# Patient Record
Sex: Male | Born: 1945 | Race: White | Hispanic: No | Marital: Married
Health system: Southern US, Community
[De-identification: ages and names within clinical notes are randomized; demographics above are authoritative.]

---

## 2005-11-19 ENCOUNTER — Ambulatory Visit: Payer: Self-pay | Admitting: Unknown Physician Specialty

## 2006-01-03 ENCOUNTER — Encounter: Payer: Self-pay | Admitting: Family Medicine

## 2006-02-02 ENCOUNTER — Other Ambulatory Visit: Payer: Self-pay

## 2006-02-02 ENCOUNTER — Inpatient Hospital Stay: Payer: Self-pay | Admitting: Internal Medicine

## 2007-03-19 ENCOUNTER — Ambulatory Visit: Payer: Self-pay | Admitting: Internal Medicine

## 2007-04-21 ENCOUNTER — Ambulatory Visit: Payer: Self-pay

## 2007-07-06 ENCOUNTER — Ambulatory Visit: Payer: Self-pay | Admitting: Family Medicine

## 2008-04-11 ENCOUNTER — Ambulatory Visit: Payer: Self-pay | Admitting: Family Medicine

## 2009-08-03 ENCOUNTER — Emergency Department: Payer: Self-pay | Admitting: Emergency Medicine

## 2010-08-23 ENCOUNTER — Inpatient Hospital Stay: Payer: Self-pay | Admitting: Internal Medicine

## 2010-09-16 ENCOUNTER — Inpatient Hospital Stay: Payer: Self-pay | Admitting: Internal Medicine

## 2010-09-26 ENCOUNTER — Inpatient Hospital Stay: Admission: RE | Admit: 2010-09-26 | Payer: Self-pay | Source: Home / Self Care | Admitting: Internal Medicine

## 2011-06-15 ENCOUNTER — Inpatient Hospital Stay: Payer: Self-pay | Admitting: Internal Medicine

## 2011-06-24 ENCOUNTER — Other Ambulatory Visit: Payer: Self-pay | Admitting: Specialist

## 2011-07-04 ENCOUNTER — Ambulatory Visit: Payer: Self-pay | Admitting: Vascular Surgery

## 2012-01-22 ENCOUNTER — Inpatient Hospital Stay: Payer: Self-pay | Admitting: *Deleted

## 2012-01-22 LAB — COMPREHENSIVE METABOLIC PANEL
Albumin: 2.7 g/dL — ABNORMAL LOW (ref 3.4–5.0)
Alkaline Phosphatase: 84 U/L (ref 50–136)
BUN: 35 mg/dL — ABNORMAL HIGH (ref 7–18)
Bilirubin,Total: 0.4 mg/dL (ref 0.2–1.0)
Calcium, Total: 7.6 mg/dL — ABNORMAL LOW (ref 8.5–10.1)
Chloride: 99 mmol/L (ref 98–107)
EGFR (African American): 34 — ABNORMAL LOW
EGFR (Non-African Amer.): 28 — ABNORMAL LOW
Osmolality: 285 (ref 275–301)
Potassium: 4.8 mmol/L (ref 3.5–5.1)
SGOT(AST): 32 U/L (ref 15–37)
SGPT (ALT): 28 U/L
Sodium: 134 mmol/L — ABNORMAL LOW (ref 136–145)
Total Protein: 6.9 g/dL (ref 6.4–8.2)

## 2012-01-22 LAB — URINALYSIS, COMPLETE
Bilirubin,UR: NEGATIVE
Ketone: NEGATIVE
Nitrite: NEGATIVE
Protein: 100
RBC,UR: 8 /HPF (ref 0–5)
Specific Gravity: 1.014 (ref 1.003–1.030)
Squamous Epithelial: NONE SEEN
WBC UR: 309 /HPF (ref 0–5)

## 2012-01-22 LAB — TROPONIN I: Troponin-I: 0.05 ng/mL

## 2012-01-22 LAB — CBC
HCT: 25.9 % — ABNORMAL LOW (ref 40.0–52.0)
HGB: 8.6 g/dL — ABNORMAL LOW (ref 13.0–18.0)
MCHC: 33 g/dL (ref 32.0–36.0)

## 2012-01-22 LAB — CK TOTAL AND CKMB (NOT AT ARMC): CK-MB: 1.8 ng/mL (ref 0.5–3.6)

## 2012-01-23 LAB — BASIC METABOLIC PANEL
Anion Gap: 12 (ref 7–16)
BUN: 41 mg/dL — ABNORMAL HIGH (ref 7–18)
Calcium, Total: 7.3 mg/dL — ABNORMAL LOW (ref 8.5–10.1)
Co2: 22 mmol/L (ref 21–32)
Creatinine: 2.59 mg/dL — ABNORMAL HIGH (ref 0.60–1.30)
EGFR (African American): 32 — ABNORMAL LOW
Osmolality: 301 (ref 275–301)
Potassium: 4.9 mmol/L (ref 3.5–5.1)

## 2012-01-23 LAB — HEMOGLOBIN A1C: Hemoglobin A1C: 7.8 % — ABNORMAL HIGH (ref 4.2–6.3)

## 2012-01-24 LAB — BASIC METABOLIC PANEL
BUN: 42 mg/dL — ABNORMAL HIGH (ref 7–18)
Co2: 25 mmol/L (ref 21–32)
Creatinine: 1.99 mg/dL — ABNORMAL HIGH (ref 0.60–1.30)
EGFR (African American): 44 — ABNORMAL LOW
EGFR (Non-African Amer.): 36 — ABNORMAL LOW
Glucose: 92 mg/dL (ref 65–99)

## 2012-01-24 LAB — CBC WITH DIFFERENTIAL/PLATELET
Basophil %: 0 %
HGB: 8.5 g/dL — ABNORMAL LOW (ref 13.0–18.0)
Lymphocyte #: 0.3 10*3/uL — ABNORMAL LOW (ref 1.0–3.6)
Lymphocyte %: 5.4 %
MCHC: 32.5 g/dL (ref 32.0–36.0)
MCV: 81 fL (ref 80–100)
Monocyte #: 0.4 10*3/uL (ref 0.0–0.7)
Neutrophil %: 88 %
Platelet: 110 10*3/uL — ABNORMAL LOW (ref 150–440)
RDW: 17.1 % — ABNORMAL HIGH (ref 11.5–14.5)
WBC: 5.9 10*3/uL (ref 3.8–10.6)

## 2012-01-24 LAB — URINE CULTURE

## 2012-01-24 LAB — HEMOGLOBIN A1C: Hemoglobin A1C: 8.2 % — ABNORMAL HIGH (ref 4.2–6.3)

## 2012-01-25 LAB — CREATININE, SERUM
EGFR (African American): 47 — ABNORMAL LOW
EGFR (Non-African Amer.): 39 — ABNORMAL LOW

## 2012-01-27 LAB — BASIC METABOLIC PANEL
BUN: 44 mg/dL — ABNORMAL HIGH (ref 7–18)
Chloride: 104 mmol/L (ref 98–107)
Creatinine: 1.62 mg/dL — ABNORMAL HIGH (ref 0.60–1.30)
EGFR (African American): 56 — ABNORMAL LOW
EGFR (Non-African Amer.): 46 — ABNORMAL LOW
Glucose: 251 mg/dL — ABNORMAL HIGH (ref 65–99)
Osmolality: 295 (ref 275–301)

## 2012-01-27 LAB — POTASSIUM: Potassium: 5.2 mmol/L — ABNORMAL HIGH (ref 3.5–5.1)

## 2012-01-28 LAB — BASIC METABOLIC PANEL
BUN: 39 mg/dL — ABNORMAL HIGH (ref 7–18)
Calcium, Total: 8.5 mg/dL (ref 8.5–10.1)
Chloride: 107 mmol/L (ref 98–107)
Creatinine: 1.62 mg/dL — ABNORMAL HIGH (ref 0.60–1.30)
EGFR (African American): 56 — ABNORMAL LOW
EGFR (Non-African Amer.): 46 — ABNORMAL LOW
Glucose: 70 mg/dL (ref 65–99)
Osmolality: 291 (ref 275–301)
Potassium: 4.1 mmol/L (ref 3.5–5.1)

## 2012-01-28 LAB — CULTURE, BLOOD (SINGLE)

## 2012-01-28 LAB — PLATELET COUNT: Platelet: 174 10*3/uL (ref 150–440)

## 2012-01-29 LAB — CULTURE, BLOOD (SINGLE)

## 2012-04-14 ENCOUNTER — Ambulatory Visit: Payer: Self-pay | Admitting: Urology

## 2012-04-16 ENCOUNTER — Inpatient Hospital Stay: Payer: Self-pay | Admitting: Internal Medicine

## 2012-04-16 LAB — COMPREHENSIVE METABOLIC PANEL
Albumin: 2.8 g/dL — ABNORMAL LOW (ref 3.4–5.0)
Alkaline Phosphatase: 92 U/L (ref 50–136)
Anion Gap: 7 (ref 7–16)
Bilirubin,Total: 0.3 mg/dL (ref 0.2–1.0)
Chloride: 105 mmol/L (ref 98–107)
Co2: 25 mmol/L (ref 21–32)
Creatinine: 2.32 mg/dL — ABNORMAL HIGH (ref 0.60–1.30)
EGFR (African American): 33 — ABNORMAL LOW
EGFR (Non-African Amer.): 29 — ABNORMAL LOW
Glucose: 207 mg/dL — ABNORMAL HIGH (ref 65–99)
Osmolality: 287 (ref 275–301)
Potassium: 4.5 mmol/L (ref 3.5–5.1)
SGOT(AST): 36 U/L (ref 15–37)
Sodium: 137 mmol/L (ref 136–145)
Total Protein: 6.4 g/dL (ref 6.4–8.2)

## 2012-04-16 LAB — URINALYSIS, COMPLETE
Bilirubin,UR: NEGATIVE
Glucose,UR: NEGATIVE mg/dL (ref 0–75)
Hyaline Cast: 32
Ketone: NEGATIVE
Ph: 5 (ref 4.5–8.0)
Protein: 100
RBC,UR: 157 /HPF (ref 0–5)
Squamous Epithelial: NONE SEEN
WBC UR: 3209 /HPF (ref 0–5)

## 2012-04-16 LAB — PROTIME-INR
INR: 1.1
Prothrombin Time: 14.5 secs (ref 11.5–14.7)

## 2012-04-16 LAB — TROPONIN I: Troponin-I: 0.58 ng/mL — ABNORMAL HIGH

## 2012-04-16 LAB — CK TOTAL AND CKMB (NOT AT ARMC)
CK, Total: 358 U/L — ABNORMAL HIGH (ref 35–232)
CK-MB: 1.9 ng/mL (ref 0.5–3.6)
CK-MB: 5.3 ng/mL — ABNORMAL HIGH (ref 0.5–3.6)

## 2012-04-16 LAB — CBC
MCH: 25.7 pg — ABNORMAL LOW (ref 26.0–34.0)
MCHC: 31.5 g/dL — ABNORMAL LOW (ref 32.0–36.0)
Platelet: 137 10*3/uL — ABNORMAL LOW (ref 150–440)

## 2012-04-17 LAB — CBC WITH DIFFERENTIAL/PLATELET
Basophil #: 0 10*3/uL (ref 0.0–0.1)
Basophil %: 0.2 %
Eosinophil #: 0 10*3/uL (ref 0.0–0.7)
Eosinophil %: 0 %
HGB: 8.2 g/dL — ABNORMAL LOW (ref 13.0–18.0)
Lymphocyte %: 2.6 %
MCH: 25.9 pg — ABNORMAL LOW (ref 26.0–34.0)
MCHC: 31.8 g/dL — ABNORMAL LOW (ref 32.0–36.0)
Neutrophil %: 95.4 %
Platelet: 94 10*3/uL — ABNORMAL LOW (ref 150–440)
RBC: 3.15 10*6/uL — ABNORMAL LOW (ref 4.40–5.90)
RDW: 18.2 % — ABNORMAL HIGH (ref 11.5–14.5)

## 2012-04-17 LAB — BASIC METABOLIC PANEL
Anion Gap: 8 (ref 7–16)
Calcium, Total: 7.2 mg/dL — ABNORMAL LOW (ref 8.5–10.1)
Co2: 23 mmol/L (ref 21–32)
EGFR (Non-African Amer.): 23 — ABNORMAL LOW
Glucose: 302 mg/dL — ABNORMAL HIGH (ref 65–99)
Osmolality: 292 (ref 275–301)
Potassium: 5.3 mmol/L — ABNORMAL HIGH (ref 3.5–5.1)

## 2012-04-17 LAB — CK TOTAL AND CKMB (NOT AT ARMC): CK, Total: 816 U/L — ABNORMAL HIGH (ref 35–232)

## 2012-04-17 LAB — HEMOGLOBIN: HGB: 8.2 g/dL — ABNORMAL LOW (ref 13.0–18.0)

## 2012-04-17 LAB — GLUCOSE, RANDOM: Glucose: 402 mg/dL — ABNORMAL HIGH (ref 65–99)

## 2012-04-18 LAB — URINE CULTURE

## 2012-04-20 LAB — BASIC METABOLIC PANEL
Anion Gap: 6 — ABNORMAL LOW (ref 7–16)
BUN: 41 mg/dL — ABNORMAL HIGH (ref 7–18)
Chloride: 109 mmol/L — ABNORMAL HIGH (ref 98–107)
Co2: 27 mmol/L (ref 21–32)
Creatinine: 1.74 mg/dL — ABNORMAL HIGH (ref 0.60–1.30)
EGFR (African American): 47 — ABNORMAL LOW
EGFR (Non-African Amer.): 41 — ABNORMAL LOW
Osmolality: 301 (ref 275–301)
Sodium: 142 mmol/L (ref 136–145)

## 2012-04-21 LAB — CBC WITH DIFFERENTIAL/PLATELET
Basophil #: 0 10*3/uL (ref 0.0–0.1)
HCT: 24.9 % — ABNORMAL LOW (ref 40.0–52.0)
HGB: 8.2 g/dL — ABNORMAL LOW (ref 13.0–18.0)
Lymphocyte #: 0.3 10*3/uL — ABNORMAL LOW (ref 1.0–3.6)
Monocyte #: 0.2 x10 3/mm (ref 0.2–1.0)
Monocyte %: 3.5 %
Neutrophil %: 91.5 %
Platelet: 106 10*3/uL — ABNORMAL LOW (ref 150–440)
RDW: 17.4 % — ABNORMAL HIGH (ref 11.5–14.5)

## 2012-04-21 LAB — BASIC METABOLIC PANEL
BUN: 37 mg/dL — ABNORMAL HIGH (ref 7–18)
Calcium, Total: 7.4 mg/dL — ABNORMAL LOW (ref 8.5–10.1)
Chloride: 105 mmol/L (ref 98–107)
Co2: 29 mmol/L (ref 21–32)
Creatinine: 1.74 mg/dL — ABNORMAL HIGH (ref 0.60–1.30)
EGFR (African American): 47 — ABNORMAL LOW
Potassium: 4.7 mmol/L (ref 3.5–5.1)
Sodium: 139 mmol/L (ref 136–145)

## 2012-04-27 ENCOUNTER — Ambulatory Visit: Payer: Self-pay | Admitting: Specialist

## 2012-06-17 ENCOUNTER — Ambulatory Visit: Payer: Self-pay | Admitting: Specialist

## 2012-08-27 ENCOUNTER — Inpatient Hospital Stay: Payer: Self-pay | Admitting: Internal Medicine

## 2012-08-27 LAB — URINALYSIS, COMPLETE
Bilirubin,UR: NEGATIVE
Blood: NEGATIVE
Glucose,UR: NEGATIVE mg/dL (ref 0–75)
Ketone: NEGATIVE
Nitrite: NEGATIVE
Ph: 5 (ref 4.5–8.0)
RBC,UR: 3 /HPF (ref 0–5)
Specific Gravity: 1.023 (ref 1.003–1.030)

## 2012-08-27 LAB — CBC WITH DIFFERENTIAL/PLATELET
Basophil #: 0.1 10*3/uL (ref 0.0–0.1)
Basophil %: 0.4 %
Eosinophil %: 0.1 %
HCT: 27.6 % — ABNORMAL LOW (ref 40.0–52.0)
HGB: 9 g/dL — ABNORMAL LOW (ref 13.0–18.0)
MCH: 25.7 pg — ABNORMAL LOW (ref 26.0–34.0)
MCV: 79 fL — ABNORMAL LOW (ref 80–100)
Monocyte %: 5 %
Neutrophil %: 90.5 %
RBC: 3.51 10*6/uL — ABNORMAL LOW (ref 4.40–5.90)

## 2012-08-27 LAB — COMPREHENSIVE METABOLIC PANEL
Albumin: 2.3 g/dL — ABNORMAL LOW (ref 3.4–5.0)
Alkaline Phosphatase: 80 U/L (ref 50–136)
Calcium, Total: 7.2 mg/dL — ABNORMAL LOW (ref 8.5–10.1)
Glucose: 83 mg/dL (ref 65–99)
SGOT(AST): 21 U/L (ref 15–37)
SGPT (ALT): 15 U/L (ref 12–78)

## 2012-08-27 LAB — TROPONIN I: Troponin-I: <0.02 ng/mL

## 2012-08-28 LAB — CBC WITH DIFFERENTIAL/PLATELET
Basophil #: 0 10*3/uL (ref 0.0–0.1)
Basophil %: 0.1 %
Eosinophil #: 0 10*3/uL (ref 0.0–0.7)
HCT: 25.4 % — ABNORMAL LOW (ref 40.0–52.0)
Lymphocyte #: 0.6 10*3/uL — ABNORMAL LOW (ref 1.0–3.6)
MCH: 25.3 pg — ABNORMAL LOW (ref 26.0–34.0)
MCHC: 32 g/dL (ref 32.0–36.0)
MCV: 79 fL — ABNORMAL LOW (ref 80–100)
Monocyte #: 1 x10 3/mm (ref 0.2–1.0)
Monocyte %: 6.1 %
Neutrophil #: 14.9 10*3/uL — ABNORMAL HIGH (ref 1.4–6.5)
RDW: 17.3 % — ABNORMAL HIGH (ref 11.5–14.5)

## 2012-08-28 LAB — IRON AND TIBC
Iron Bind.Cap.(Total): 150 ug/dL — ABNORMAL LOW (ref 250–450)
Iron Saturation: 6 %
Iron: 9 ug/dL — ABNORMAL LOW (ref 65–175)

## 2012-08-28 LAB — BASIC METABOLIC PANEL
BUN: 28 mg/dL — ABNORMAL HIGH (ref 7–18)
Calcium, Total: 7 mg/dL — CL (ref 8.5–10.1)
Chloride: 104 mmol/L (ref 98–107)
Co2: 23 mmol/L (ref 21–32)
Creatinine: 2.27 mg/dL — ABNORMAL HIGH (ref 0.60–1.30)
EGFR (African American): 34 — ABNORMAL LOW
EGFR (Non-African Amer.): 29 — ABNORMAL LOW
Osmolality: 280 (ref 275–301)

## 2012-08-28 LAB — HEPATIC FUNCTION PANEL A (ARMC)
SGOT(AST): 18 U/L (ref 15–37)
SGPT (ALT): 17 U/L (ref 12–78)

## 2012-08-28 LAB — MAGNESIUM: Magnesium: 1.7 mg/dL — ABNORMAL LOW

## 2012-08-28 LAB — CLOSTRIDIUM DIFFICILE BY PCR

## 2012-08-29 LAB — CBC WITH DIFFERENTIAL/PLATELET
Basophil #: 0 10*3/uL (ref 0.0–0.1)
Eosinophil #: 0 10*3/uL (ref 0.0–0.7)
Eosinophil %: 0.1 %
Lymphocyte %: 4.2 %
MCH: 25 pg — ABNORMAL LOW (ref 26.0–34.0)
MCHC: 31.6 g/dL — ABNORMAL LOW (ref 32.0–36.0)
Monocyte #: 0.7 x10 3/mm (ref 0.2–1.0)
Neutrophil %: 89.2 %
RDW: 17.6 % — ABNORMAL HIGH (ref 11.5–14.5)
WBC: 11.4 10*3/uL — ABNORMAL HIGH (ref 3.8–10.6)

## 2012-08-31 LAB — CBC WITH DIFFERENTIAL/PLATELET
Basophil #: 0 10*3/uL (ref 0.0–0.1)
Basophil %: 0.4 %
Eosinophil #: 0 10*3/uL (ref 0.0–0.7)
HCT: 24.3 % — ABNORMAL LOW (ref 40.0–52.0)
HGB: 7.8 g/dL — ABNORMAL LOW (ref 13.0–18.0)
Lymphocyte %: 5.9 %
MCHC: 32.2 g/dL (ref 32.0–36.0)
Neutrophil #: 6.2 10*3/uL (ref 1.4–6.5)
Neutrophil %: 86.6 %
Platelet: 220 10*3/uL (ref 150–440)
RDW: 17.7 % — ABNORMAL HIGH (ref 11.5–14.5)
WBC: 7.1 10*3/uL (ref 3.8–10.6)

## 2012-08-31 LAB — BASIC METABOLIC PANEL
Anion Gap: 8 (ref 7–16)
BUN: 47 mg/dL — ABNORMAL HIGH (ref 7–18)
Calcium, Total: 8.3 mg/dL — ABNORMAL LOW (ref 8.5–10.1)
Chloride: 106 mmol/L (ref 98–107)
Co2: 25 mmol/L (ref 21–32)
Creatinine: 1.87 mg/dL — ABNORMAL HIGH (ref 0.60–1.30)
Glucose: 184 mg/dL — ABNORMAL HIGH (ref 65–99)
Osmolality: 295 (ref 275–301)
Potassium: 5 mmol/L (ref 3.5–5.1)

## 2012-09-01 LAB — CBC WITH DIFFERENTIAL/PLATELET
Basophil #: 0 10*3/uL (ref 0.0–0.1)
Basophil %: 0.5 %
Eosinophil #: 0 10*3/uL (ref 0.0–0.7)
Lymphocyte %: 8.5 %
MCH: 25.7 pg — ABNORMAL LOW (ref 26.0–34.0)
MCHC: 32.2 g/dL (ref 32.0–36.0)
Monocyte #: 0.5 x10 3/mm (ref 0.2–1.0)
Neutrophil %: 81.2 %
Platelet: 224 10*3/uL (ref 150–440)
RBC: 2.86 10*6/uL — ABNORMAL LOW (ref 4.40–5.90)
RDW: 17.4 % — ABNORMAL HIGH (ref 11.5–14.5)
WBC: 5.2 10*3/uL (ref 3.8–10.6)

## 2012-09-01 LAB — BASIC METABOLIC PANEL
Anion Gap: 7 (ref 7–16)
Calcium, Total: 8.1 mg/dL — ABNORMAL LOW (ref 8.5–10.1)
Co2: 24 mmol/L (ref 21–32)
Creatinine: 1.73 mg/dL — ABNORMAL HIGH (ref 0.60–1.30)
EGFR (African American): 47 — ABNORMAL LOW
EGFR (Non-African Amer.): 41 — ABNORMAL LOW
Glucose: 147 mg/dL — ABNORMAL HIGH (ref 65–99)
Sodium: 143 mmol/L (ref 136–145)

## 2012-09-02 LAB — CBC WITH DIFFERENTIAL/PLATELET
Basophil %: 0.6 %
Eosinophil %: 1.1 %
HGB: 7.8 g/dL — ABNORMAL LOW (ref 13.0–18.0)
Lymphocyte #: 0.4 10*3/uL — ABNORMAL LOW (ref 1.0–3.6)
Lymphocyte %: 6.8 %
MCH: 25.4 pg — ABNORMAL LOW (ref 26.0–34.0)
MCV: 81 fL (ref 80–100)
Monocyte #: 0.5 x10 3/mm (ref 0.2–1.0)
Monocyte %: 7.7 %
Neutrophil #: 4.9 10*3/uL (ref 1.4–6.5)
Neutrophil %: 83.8 %
Platelet: 244 10*3/uL (ref 150–440)
RBC: 3.06 10*6/uL — ABNORMAL LOW (ref 4.40–5.90)
WBC: 5.8 10*3/uL (ref 3.8–10.6)

## 2012-09-02 LAB — BASIC METABOLIC PANEL
Anion Gap: 10 (ref 7–16)
BUN: 32 mg/dL — ABNORMAL HIGH (ref 7–18)
Chloride: 109 mmol/L — ABNORMAL HIGH (ref 98–107)
Creatinine: 1.62 mg/dL — ABNORMAL HIGH (ref 0.60–1.30)
EGFR (African American): 51 — ABNORMAL LOW
EGFR (Non-African Amer.): 44 — ABNORMAL LOW
Potassium: 4.3 mmol/L (ref 3.5–5.1)
Sodium: 142 mmol/L (ref 136–145)

## 2012-09-02 LAB — CULTURE, BLOOD (SINGLE)

## 2012-09-04 LAB — AEROBIC CULTURE

## 2013-08-23 ENCOUNTER — Emergency Department: Payer: Self-pay | Admitting: Emergency Medicine

## 2013-08-24 LAB — URINALYSIS, COMPLETE
Bilirubin,UR: NEGATIVE
Ketone: NEGATIVE
Ph: 5 (ref 4.5–8.0)
RBC,UR: 10 /HPF (ref 0–5)
WBC UR: 945 /HPF (ref 0–5)

## 2013-08-24 LAB — COMPREHENSIVE METABOLIC PANEL
Albumin: 3.1 g/dL — ABNORMAL LOW (ref 3.4–5.0)
Anion Gap: 5 — ABNORMAL LOW (ref 7–16)
BUN: 23 mg/dL — ABNORMAL HIGH (ref 7–18)
Bilirubin,Total: 0.2 mg/dL (ref 0.2–1.0)
Calcium, Total: 9 mg/dL (ref 8.5–10.1)
Chloride: 103 mmol/L (ref 98–107)
Co2: 29 mmol/L (ref 21–32)
EGFR (African American): 40 — ABNORMAL LOW
Osmolality: 284 (ref 275–301)
Total Protein: 7.2 g/dL (ref 6.4–8.2)

## 2013-08-24 LAB — CBC
HCT: 34.5 % — ABNORMAL LOW (ref 40.0–52.0)
HGB: 11.7 g/dL — ABNORMAL LOW (ref 13.0–18.0)
MCHC: 33.8 g/dL (ref 32.0–36.0)
MCV: 87 fL (ref 80–100)
Platelet: 140 10*3/uL — ABNORMAL LOW (ref 150–440)
RBC: 3.96 10*6/uL — ABNORMAL LOW (ref 4.40–5.90)
WBC: 11.7 10*3/uL — ABNORMAL HIGH (ref 3.8–10.6)

## 2013-08-24 LAB — LIPASE, BLOOD: Lipase: 34 U/L — ABNORMAL LOW (ref 73–393)

## 2013-08-27 LAB — URINE CULTURE

## 2014-07-24 IMAGING — XA IR VASCULAR PROCEDURE
1 series · 4 of 4 positions shown · non-contrast
Comparison: none

[Series 1: run · 4 of 25 frames shown]
[frame 4/25]
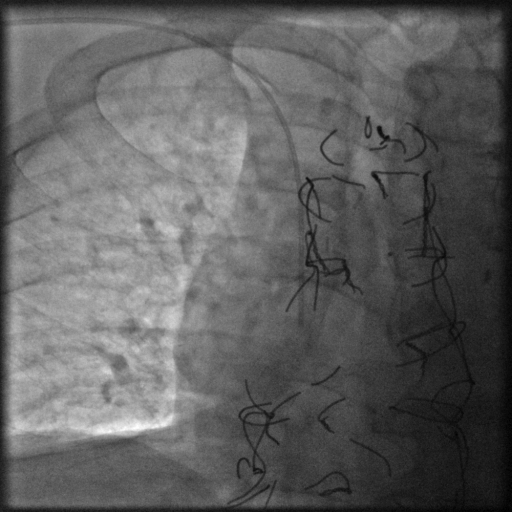
[frame 13/25]
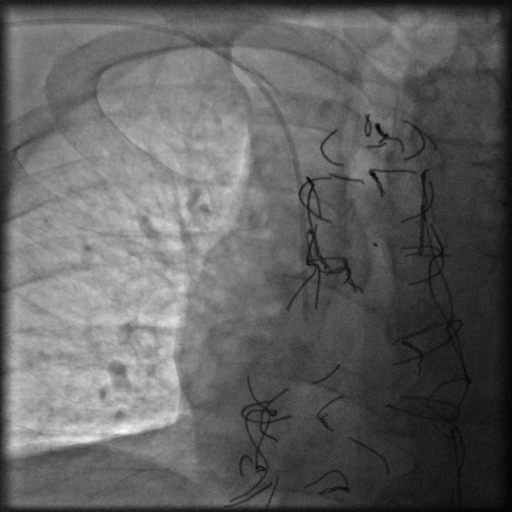
[frame 22/25]
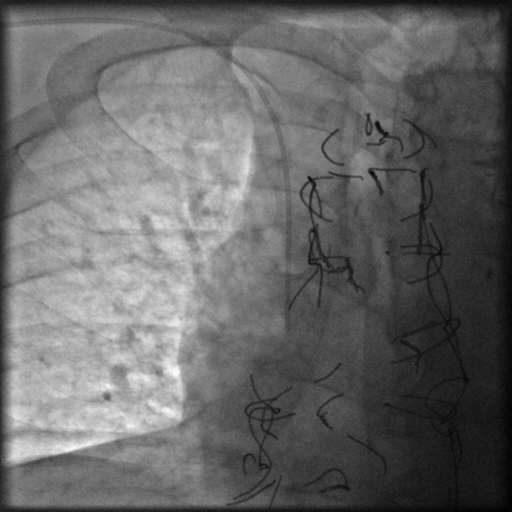
[frame 25/25]
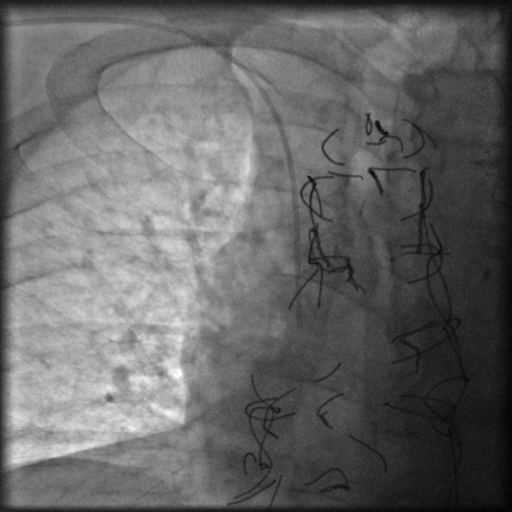

[4 of 4 positions shown; findings below may reference images not displayed]

IMAGES IMPORTED FROM THE [employer] WORKFLOW SYSTEM
NO DICTATION FOR STUDY

## 2014-10-03 IMAGING — CR DG CHEST 2V
1 series · 3 of 3 positions shown · non-contrast
Comparison: none

REASON FOR EXAM: sob
COMMENTS:

[Series 1: x chest ap · 0.14mm/px · 3 of 3 slices shown]
[im 1/3]
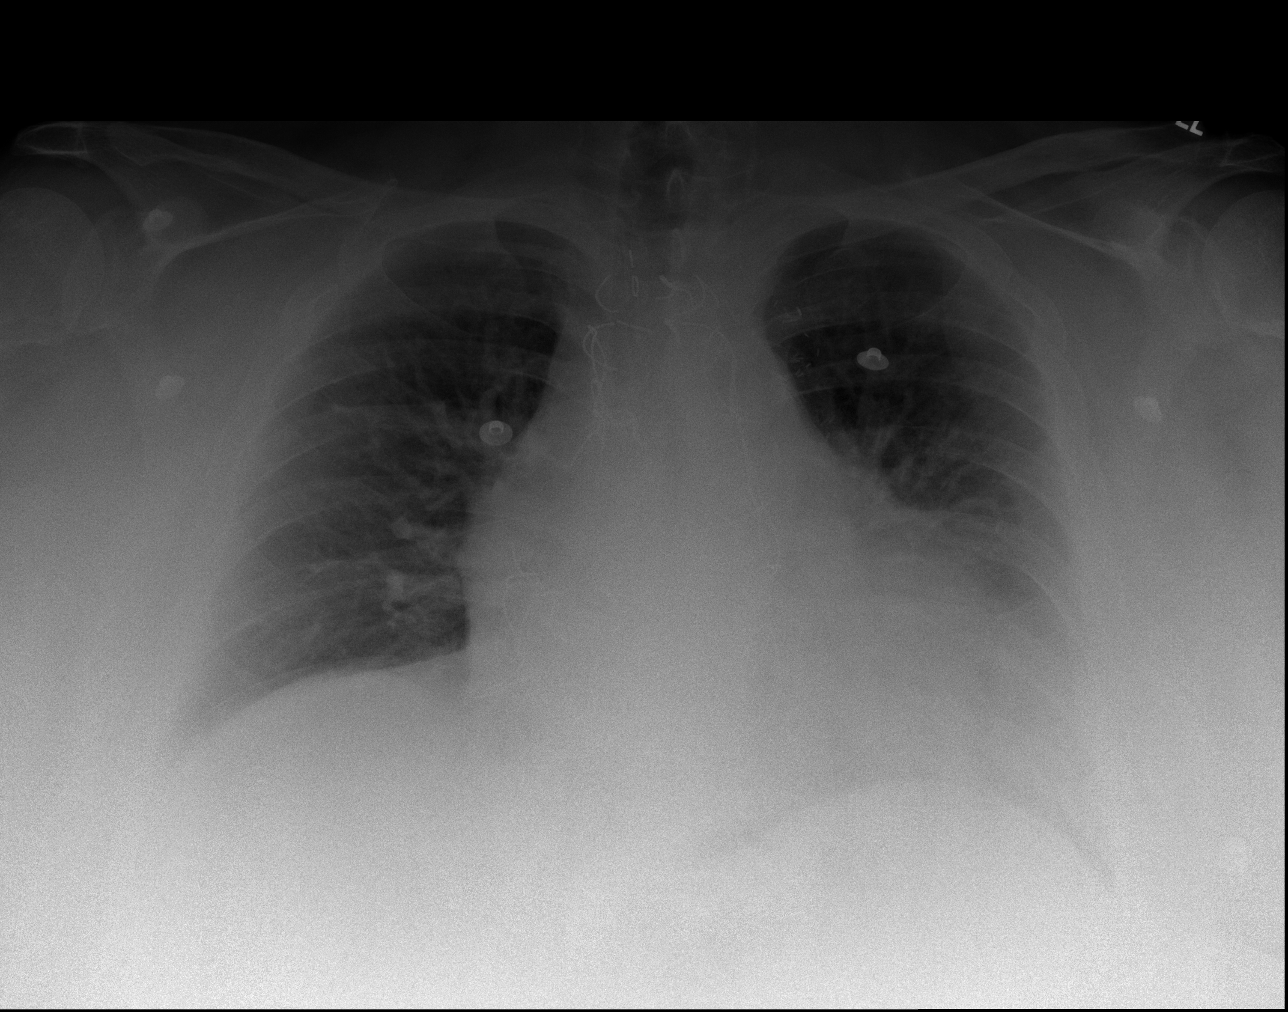
[im 2/3]
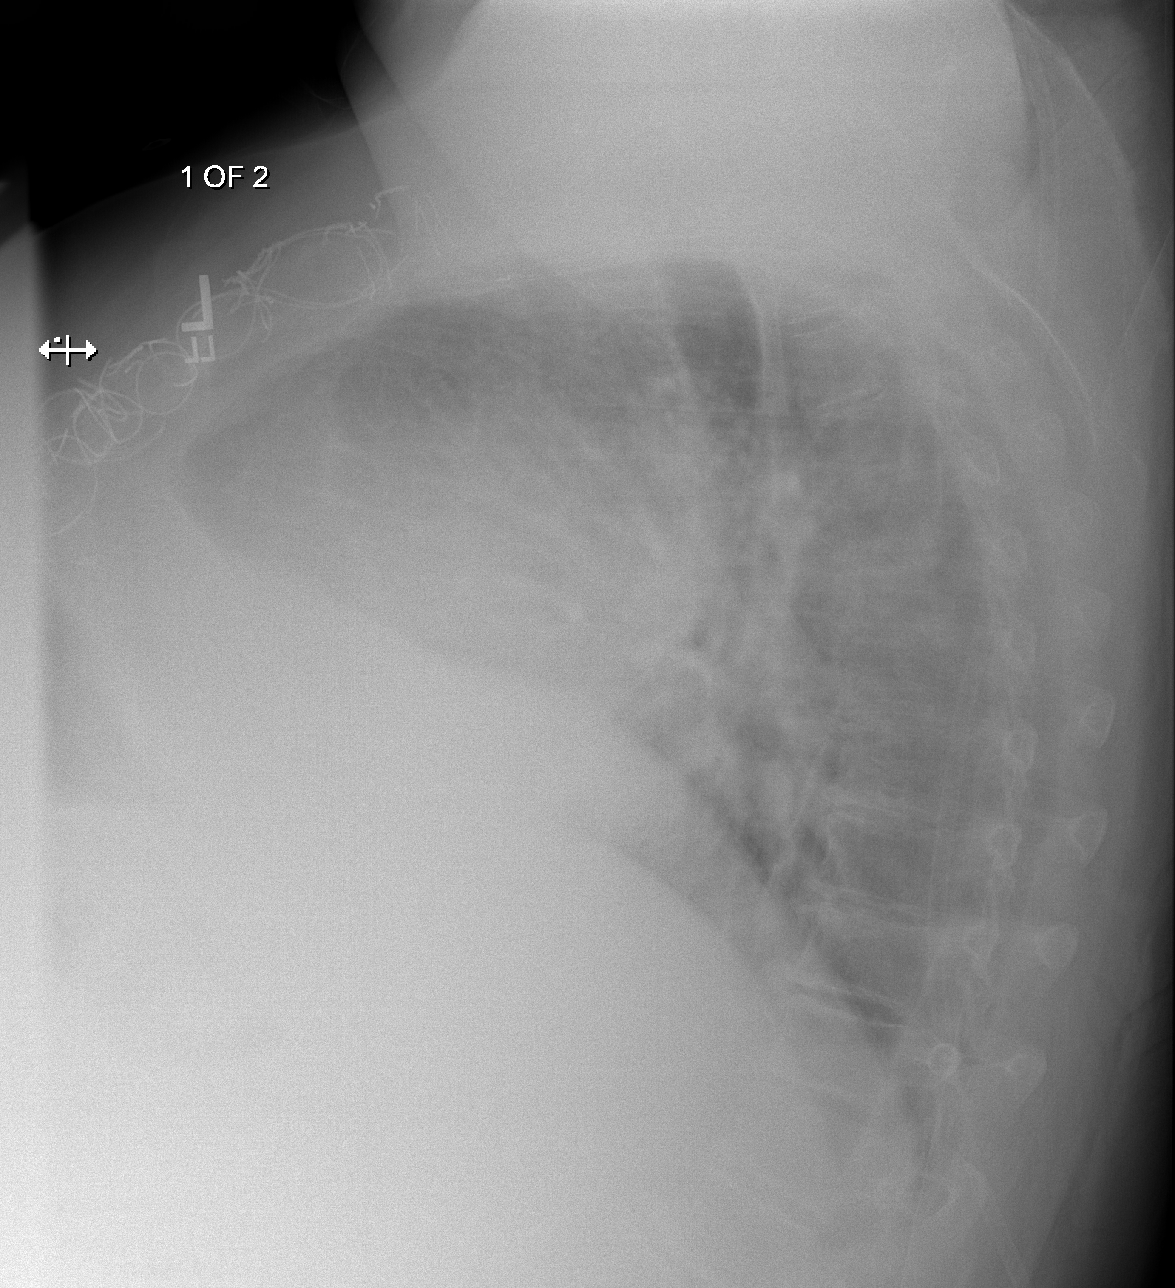
[im 3/3]
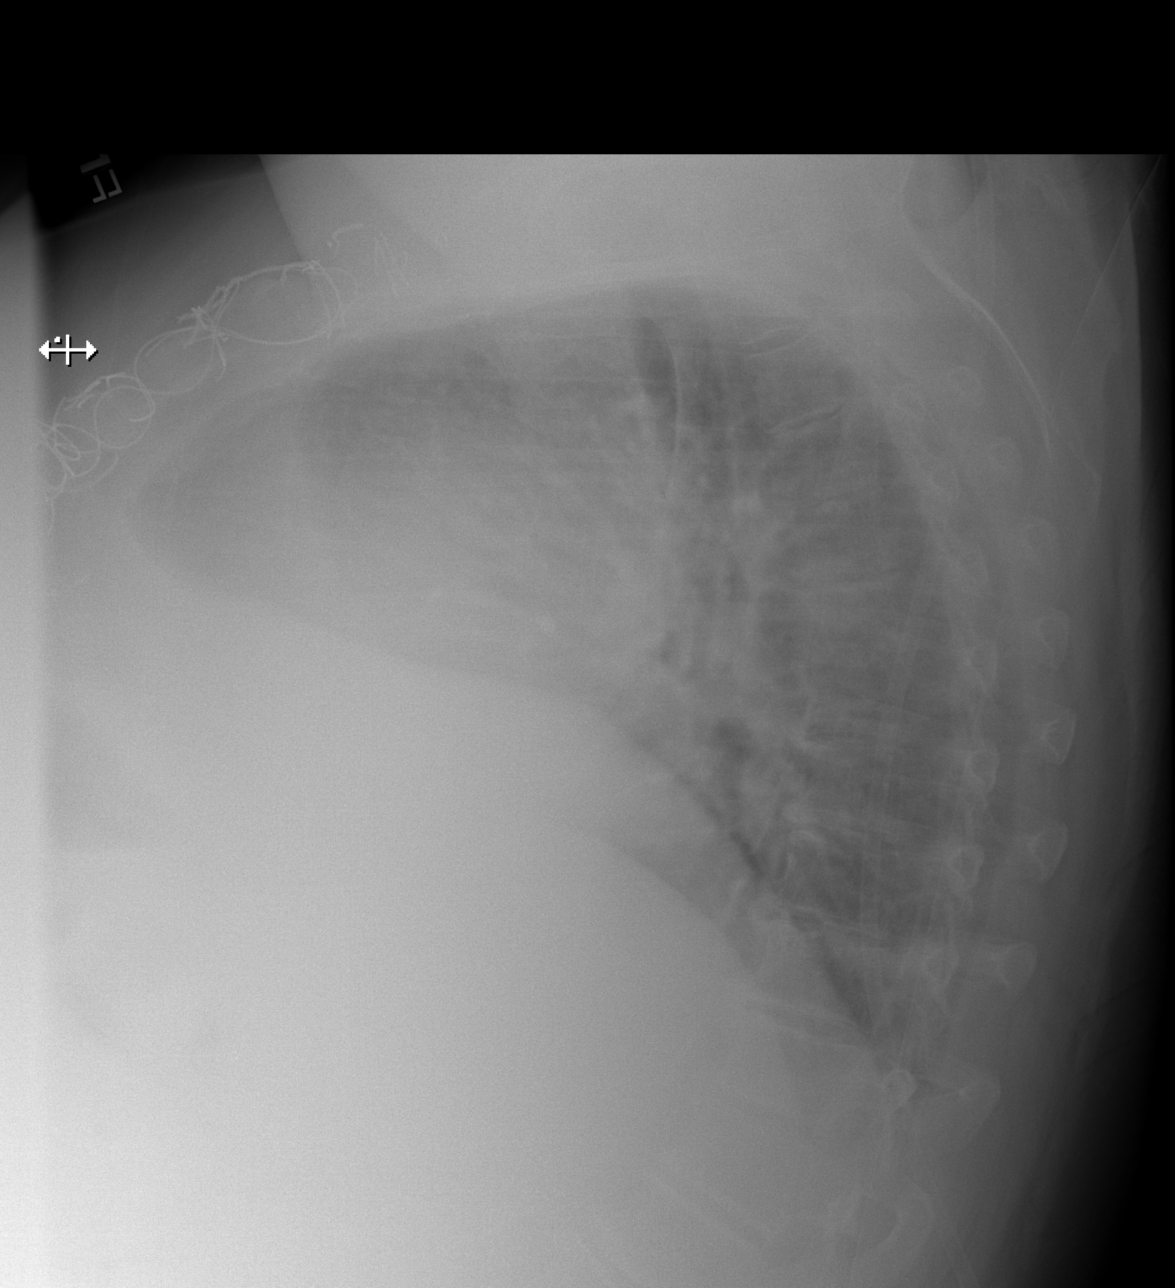

[3 of 3 positions shown; findings below may reference images not displayed]

PROCEDURE:     DXR - DXR CHEST PA (OR AP) AND LATERAL  - August 27, 2012  [DATE]

RESULT:     The patient is positioned in a somewhat lordotic fashion. The
lungs are mildly hypoinflated. The cardiac silhouette is enlarged. The
central pulmonary vascularity is prominent. There is no pleural effusion.
IMPRESSION: There is mild pulmonary hypoinflation. There is no focal
pneumonia. I cannot exclude pulmonary vascular congestion in the appropriate
clinical setting.

[REDACTED]

## 2015-02-07 NOTE — Op Note (Signed)
PATIENT NAME:  Craig Love, Craig Love MR#:  [id number] DATE OF BIRTH:  03-24-1946  DATE OF PROCEDURE:  06/17/2012  PREOPERATIVE DIAGNOSIS: Urinary tract infection with poor venous access and need for extended IV antibiotics.   POSTOPERATIVE DIAGNOSIS: Urinary tract infection with poor venous access and need for extended IV antibiotics.  PROCEDURES:  1. Ultrasound guidance for vascular access to right basilic vein.  2. Fluoroscopic guidance for placement of catheter.  3. Insertion of peripherally inserted central venous catheter, right arm.  SURGEON: Annice NeedyJason S. Dew, MD   ANESTHESIA: Local.   ESTIMATED BLOOD LOSS: Minimal.   FLUOROSCOPY TIME: Less than 1 minute.   CONTRAST USED: None.   INDICATION FOR PROCEDURE: This is Love 69 year old male with urinary tract infection requiring extended IV antibiotics and PICC line is requested.   DESCRIPTION OF PROCEDURE: The patient's right arm was sterilely prepped and draped, and Love sterile surgical field was created. The right basilic vein was accessed under direct ultrasound guidance without difficulty with Love micropuncture needle and permanent image was recorded. 0.018 wire was then placed into the superior vena cava. Peel-away sheath was placed over the wire. Love single lumen peripherally inserted central venous catheter was then placed over the wire and the wire and peel-away sheath were removed. The catheter tip was placed into the superior vena cava and was secured at the skin at 46 cm with Love sterile dressing. The catheter withdrew blood well and flushed easily with heparinized saline.    The patient tolerated the procedure well.  ____________________________ Annice NeedyJason S. Dew, MD jsd:drc D: 06/17/2012 15:15:30 ET T: 06/17/2012 15:56:01 ET JOB#: [id number]  cc: Annice NeedyJason S. Dew, MD, <Dictator> Annice NeedyJASON S DEW MD ELECTRONICALLY SIGNED 06/24/2012 10:02

## 2015-02-07 NOTE — Op Note (Signed)
PATIENT NAME:  Craig Love, [name] MR#:  [id number] DATE OF BIRTH:  [birth year]  DATE OF PROCEDURE:  09/01/2012  PREOPERATIVE DIAGNOSIS: Acute laryngeal pain with swallowing.   POSTOPERATIVE DIAGNOSIS: Acute laryngeal pharyngitis with no evidence of obstruction.   OPERATIVE PROCEDURE: Flexible laryngoscopy.   SURGEON: Cammy CopaPaul H. Courtnie Brenes, MD   ANESTHESIA: Topical.   COMPLICATIONS: None.   DESCRIPTION OF PROCEDURE: The patient was seen at the bedside. After discussing this with the patient and his wife, the nose was sprayed with 4% Xylocaine mixed with Afrin. The flexible scope was lubricated and placed through his right nostril as this was a little more open and the septum deviated to the left. The nose does not show any signs of acute infection on either side. There are no polyps or lesions. His nasopharynx was clear. The hypopharynx shows acute redness involving the epiglottis and the entire laryngeal inlet. There is no swelling in the epiglottis at all. The vocal cords are also reddened on both sides, but they are not swollen. There is no sign of airway obstruction. The redness does not extend much outside the laryngeal inlet except for up onto the epiglottis. There is no ulcer anywhere. There is no exudate anywhere and no sign of significant  swelling but  just the redness.   IMPRESSION: The patient has acute laryngeal pharyngitis. He is started on Augmentin today. The culture done yesterday showed a lot of gram-positive cocci in pairs and chains and suspicion for strep. The Augmentin should cover this well. He is taking liquids well and over the next several days should turn around and do much better. His Spiriva has been held which will help him as well. The patient tolerated the procedure well. This was done at the bedside. There were no operative complications.  ____________________________ Cammy CopaPaul H. Aunika Kirsten, MD phj:cbb D: 09/01/2012 18:24:46 ET T: 09/02/2012 08:07:44 ET JOB#: [id number] Cammy CopaPAUL H Kyan Giannone  MD ELECTRONICALLY SIGNED 09/03/2012 7:36

## 2015-02-07 NOTE — H&P (Signed)
PATIENT NAME:  Craig Love, Craig Love MR#:  [id number] DATE OF BIRTH:  23-Oct-1945  DATE OF ADMISSION:  08/27/2012  ER REFERRING PHYSICIAN: Dr. Cyril LoosenKinner   PRIMARY CARE PHYSICIAN: Dr. Tyrone SageSally Patel, [facility]  CHIEF COMPLAINT: Diarrhea, poor urine output, fever, weakness, poor appetite.   HISTORY OF PRESENT ILLNESS: The patient is Love 69 year old male with multiple medical problems including diabetes, hypertension, transient ischemic attack, gastroesophageal reflux disease, sleep apnea, coronary artery disease, and diastolic heart failure who was [facility] from 06/13 to 05/02/2012 for septic shock due to ESBL Escherichia coli. He received IV ertapenem at that time. He was also found to have acute on chronic renal failure and acute respiratory failure. The patient presented with history of diarrhea for the past 1-1/2 weeks. He reports that initially he had several bowel movements, but currently he is going 3 to 4 times Love day with very loose bowel movements.  There is no blood or mucus in the stool. He has also been having fever, sore throat, coughing, poor appetite, reduced urine output, and weakness in addition to chills. He reports he did get his flu shot. He reports that he did get his flu shot this year. He was found to have low-grade fever in the Emergency Room with leukocytosis and acute on chronic renal failure.   ALLERGIES: No known drug allergies.   CURRENT MEDICATIONS:  1. Toprol-XL 100 mg daily.  2. Flomax 0.4 mg daily.  3. Oxygen 2 liters.  4. Lasix 20 mg on Monday, Wednesday, and Friday. 5. Novolin 70/30, 65 units subq twice Love day.  6. Ketoconazole shampoo three times Love week. 7. Vitamin D 400 international units twice Love day. 8. Betamethasone, apply to affected area twice Love day. 9. Avodart 0.5 mg daily.  10. Neurontin 300 mg b.i.d.  11. Voltaren gel 1%, 2 grams, apply to affected joint 4 times Love day as needed.  12. Protonix 40 mg daily.  13. Plavix 75 mg daily.  14. Lipitor  20 mg daily.  15. Sertraline 150 mg daily.  16. Risperdal 0.25 mg b.i.d.  17. Sensipar 60 mg b.i.d. 18. Spiriva daily.  19. Advair 250/50  1 puff b.i.d.  20. Colace 100 mg b.i.d.  21. Multivitamin 1 tablet daily.  22. Tylenol p.r.n.  23. Aspirin 81 mg daily.  24. DuoNebs p.r.n.  25. Nitroglycerin p.r.n.  26. MiraLAX p.r.n.   PAST MEDICAL HISTORY:  1. Gout.  2. Diabetic neuropathy. 3. Seborrheic dermatitis. 4. Osteopenia. 5. Carpal tunnel syndrome.  6. Chronic diastolic congestive heart failure.  7. History of recurrent falls.  8. Hypercalcemia.  9. Coronary artery disease. 10. Sleep apnea. 11. CPAP.  12. Gastroesophageal reflux disease.  13. Transient ischemic attack. 14. Morbid obesity.  15. Diabetes.  16. Hypertension.  17. History of recurrent urinary tract infections. Last urinary tract infection was in June/ July, which was ESBL urinary tract infection for which the patient had to be treated with IV Invanz. 18. Benign prostatic hypertrophy with history of urinary obstruction. 19. Chronic kidney disease stage III.  20. History of cerebrovascular accident. 21. Chronic obstructive pulmonary disease. 22. Arthritis.  23. Depression.   PAST SURGICAL HISTORY:  1. Transurethral resection of the prostate as well as laser prostatectomy. 2. Carpal tunnel release.  FAMILY HISTORY: Positive for diabetes, hypertension. Father had myocardial infarction at the age of 67. Mother had Love stroke at the age of 67.   SOCIAL HISTORY:  No history of smoking, alcohol, or drug abuse. He  is married and lives with his wife.     REVIEW OF SYSTEMS: CONSTITUTIONAL: Reports fever, fatigue, and weakness. EYES: Denies any blurred or double vision. ENT: Denies any tinnitus or ear pain. RESPIRATORY: Reports cough and shortness of breath. CARDIOVASCULAR: Denies any chest pain or palpitations. GI: Reports diarrhea and abdominal pain. GU: Denies any dysuria or hematuria. ENDO: Denies any polyuria or  nocturia. HEME/LYMPH: Denies any anemia or easy bruisability. INTEGUMENT:  Denies any acne or rash. MUSCULOSKELETAL: Denies any swelling or gout. NEUROLOGICAL: Denies any numbness or weakness. PSYCH: Has Love history of depression.   PHYSICAL EXAMINATION:  VITAL SIGNS: Temperature 100, pulse 95, respiratory rate 24, blood pressure 122/52, pulse oximetry 94% on room air.   GENERAL: The patient is Love 69 year old Caucasian male who is morbidly obese, lying comfortably in bed, not in acute distress.   HEAD: Atraumatic, normocephalic.   EYES: There is some pallor. No icterus or cyanosis. Pupils equal, round, reactive to light and accommodation. Extraocular movements intact.   ENT:  Dry mucous membranes. No oropharyngeal erythema or thrush.   NECK: Supple. No masses. No JVD. No thyromegaly or lymphadenopathy.   CHEST WALL: No tenderness to palpation. Not using accessory muscles of respiration. No intercostal muscle retractions.   LUNGS: Bilaterally clear to auscultation.  Difficult exam due to body habitus. No wheezing, rales, or rhonchi.   CARDIOVASCULAR: S1, S2 regular. No murmur, rubs, or gallops.   ABDOMEN: Obese, soft, nontender, nondistended. No guarding or rigidity. The patient has Love hernia in his epigastric area.   SKIN: No rashes or lesions.   PERIPHERIES: The patient has 2+ pedal edema. Poorly palpable dorsalis pedis.   MUSCULOSKELETAL: No cyanosis or clubbing.   NEUROLOGIC: Awake, alert, oriented times three. Nonfocal neurological exam. Cranial nerves grossly intact.   PSYCH: Normal mood and affect.   LABORATORY, DIAGNOSTIC, AND RADIOLOGICAL DATA: Urinalysis shows no evidence of infection.   Chest x-ray:  Mild pulmonary hyperinflation. White count 16.9, hemoglobin 9, hematocrit 27.6, platelet count 225, glucose 83, BUN 25, creatinine 2.32, sodium 138, potassium 4.4, chloride 104, CO2 24, calcium 7.2. Cardiac enzymes normal. BNP 2971.   ASSESSMENT AND PLAN: 69 year old male with  multiple medical problems presents with diarrhea, fever, sore throat, coughing, reduced appetite, and oliguria. 1. Diarrhea:  We will send stool for C. difficile, stool studies, and stool WBCs. Further management will depend upon the results of the stool studies.  2. Fever: The patient has Love history of ESBL Escherichia coli. However, currently his urinalysis does not show any evidence of infection. We will check blood cultures. We will also check for influenza Love and B because the patient reports some coughing and sore throat.  3. Acute renal failure on chronic kidney disease: We will give the patient gentle IV fluid hydration in view of his chronic diastolic congestive heart failure. We will monitor strict ins and outs. Avoid nephrotoxic medications, avoid hypotension, and monitor his creatinine closely.  4. Leukocytosis: Possibly related to acute infection. We will monitor closely.  5. Anemia: Likely of chronic disease, appears to be stable at present.  6. Diabetes: We will continue the patient's insulin therapy based on insulin sliding scale and ADA diet.  7. Hypertension: Appears to be well controlled at present. We will continue the patient's Toprol. We will hold his Lasix for the time being to avoid hypotension.  8. History of gastroesophageal reflux disease: We will continue PPI.  9. History of sleep apnea: We will continue CPAP at night.  10. History  of coronary artery disease and diastolic congestive heart failure: Appears to be well compensated at present. We will continue the patient's aspirin and beta blocker. 11. History of diabetic neuropathy: We will continue the patient's Neurontin. 12. History of hyperlipidemia: We will continue the patient's statin therapy.   Reviewed all medical records, discussed with the Emergency Department physician, discussed with the patient and his wife the plan of care and management.   TIME SPENT: 75 minutes.  ____________________________ Darrick Meigs, MD sp:bjt D: 08/27/2012 21:12:18 ET T: 08/28/2012 07:02:04 ET JOB#: [id number]  cc: Darrick Meigs, MD, <Dictator> Sarah "Sallie" Allena Katz, MD Darrick Meigs MD ELECTRONICALLY SIGNED 08/28/2012 13:29

## 2015-02-07 NOTE — Consult Note (Signed)
PATIENT NAME:  Craig Love, Craig Love MR#:  [id number] DATE OF BIRTH:  Oct 20, 1946  DATE OF CONSULTATION:  08/31/2012  REFERRING PHYSICIAN:  Dr. Imogene Burn  CONSULTING PHYSICIAN:  Cammy Copa, MD  PRIMARY CARE PHYSICIAN: Dr. Maryruth Hancock at [facility]    REASON FOR CONSULTATION: Acute sore throat.   HISTORY OF PRESENT ILLNESS: Patient is a 69 year old white male who has had multiple medical problems including diabetes, hypertension, transient ischemic attack, GE reflux disease, sleep apnea, coronary disease and diastolic heart failure who had septic shock in July and August of this year due to Escherichia coli. He has also had acute on chronic renal failure and acute respiratory failure. He has had diarrhea now for the past 1-1/2 weeks that got him admitted to the hospital. York Spaniel he had a sore throat for about 2-1/2 weeks. It is getting to the point where he is having more difficulty of swallowing any solid food, it hurts to swallow. He is not coughing anything out of his throat. He is very dry inside his mouth and throat, has been that way for quite some time. He can get liquids down and ice cream down okay. He has not coughed up any blood either.   CURRENT MEDICATIONS: As noted in the chart and reviewed. He has been on Spiriva and Flomax and several other medications that are fairly drying to the mouth.    DRUG ALLERGIES: None known.   PAST MEDICAL HISTORY: Significant for:  1. Gout.  2. Diabetic neuropathy. 3. Seborrheic dermatitis. 4. Osteopenia. 5. Carpal tunnel syndrome.  6. Chronic diastolic congestive heart failure. 7. Hypercalcemia. 8. Coronary artery disease. 9. Sleep apnea, on CPAP. 10. Gastroesophageal reflux disease. 11. Transient ischemic attack.  12. Morbid obesity. 13. Diabetes. 14. Hypertension. 15. History of urinary tract infections. 16. Benign prostatic hypertrophy with history of urinary obstruction. 17. Chronic kidney disease stage III. 18. Previous cerebrovascular  accident. 19. Chronic obstructive pulmonary disease. 20. Arthritis. 21. Depression.   FAMILY HISTORY: Significant for diabetes and hypertension. Both parents have passed away.   SOCIAL HISTORY: He denies any smoking, alcohol or drug abuse.    PHYSICAL EXAMINATION:  VITAL SIGNS: He is running a fever up to 101 today. He feels warm. He is alert and oriented and seems to be a good historian.   HEENT: His ears are not causing any problems. I don't see any signs of infection. The nose looks open anteriorly. Oropharynx shows some rotted away teeth. The posterior palate and uvula is reddened. Posterior pharyngeal wall is red and I do not see any ulcerations anywhere. There is no exudate anywhere. There is a little bit of thick yellowish mucus on the palate. I cannot see the larynx well secondary to gag.   NECK: His neck is negative for any nodes or masses or any swelling in the neck. There is no evidence for cellulitis or acute tenderness in the neck.   IMPRESSION: He has an extremely dry mouth that is some of where the irritation of the mucosa is coming from. He doesn't have any water in his CPAP machine as discussed that needs to have moisture in it so his pharynx does not dry out overnight. The Spiriva is definitely adding to the dryness and the dryer the mucosa the more likely unusual bacteria may grow here. He will get some moisture for that, make sure he is sipping and drinking more liquids. If we can switch his Spiriva to albuterol temporarily to eliminate some of the dryness from  that I think it will help. Flomax also is adding to the dryness. He has redness with the string of yellow mucus there. We are going to culture his pharynx and see if there is unusual bacteria that needs coverage here. He does not have any outward signs of epiglottitis or any airway obstruction. Will plan to do a flexible laryngoscopy tomorrow if his pharyngitis is the same. If he seems like he is improving tomorrow he may  not need laryngoscopy and just getting moisture in his throat and covering any bacterial infection that shows up on culture may sufficient enough to resolve his problem.   ____________________________ Cammy CopaPaul H. Devlyn Retter, MD phj:cms D: 08/31/2012 18:49:07 ET T: 09/01/2012 09:47:37 ET JOB#: [id number]  cc: Cammy CopaPaul H. Ludy Messamore, MD, <Dictator>  Cammy CopaPAUL H Camile Esters MD ELECTRONICALLY SIGNED 09/03/2012 7:36

## 2015-02-07 NOTE — Discharge Summary (Signed)
PATIENT NAME:  Craig Love, Craig Love MR#:  [id number] DATE OF BIRTH:  June 05, 1946  DATE OF ADMISSION:  08/27/2012 DATE OF DISCHARGE:  09/02/2012  PRIMARY CARE PHYSICIAN: Craig AldoSarah Patel, MD  DISCHARGE DIAGNOSES:  1. Clostridium difficile colitis. 2. Systemic antiinflammatory response syndrome.  3. Acute renal failure, on chronic kidney disease.  4. Laryngopharyngitis. 5. Anemia. 6. Diabetes. 7. Coronary artery disease. 8. Obstructive sleep apnea. 9. Morbid obesity.   CONDITION: Stable.   CODE STATUS: FULL CODE.  PROCEDURES: Laryngoscopy.  HOME MEDICATIONS: [facility].   NEW MEDICATION:  1. Flagyl 500 mg p.o. every 8 hours for seven days. 2. Augmentin 875 mg/125 mg p.o. twice Love day for nine days.  3. Insulin 70/30 was increased from 65 units to 70 units twice Love day before meals.  MEDICATIONS TO STOP: Spiriva and Lasix.   DISCHARGE INSTRUCTIONS: The patient needs home health and physical therapy, also needs home oxygen, 2 liters by nasal cannula with CPAP p.r.n.   DIET: Low sodium, low fat, low cholesterol, ADA diet.   ACTIVITY: As tolerated.   FOLLOW-UP CARE: Follow-up with PCP within 1 to 2 weeks   REASON FOR ADMISSION: Diarrhea, poor urine output, fever, weakness, and poor appetite.   HOSPITAL COURSE:  1. The patient is Love 69 year old Caucasian male with Love history of diabetes, hypertension, transient ischemic attack, gastroesophageal reflux disease, sleep apnea, coronary artery disease, and diastolic heart failure who presented to the ED with diarrhea and poor urine output. For detailed history and physical examination, please refer to the admission note dictated by Craig Love. On admission date, the patient's chest x-ray showed mild pulmonary hyperinflation. White count was 16.9, hemoglobin 9, BUN 25, creatinine 2.32, and her BNP was 2971. After admission, the patient has been treated with Flagyl. Diarrhea resolved. Stool  Clostridium difficile test showed C. difficile.   2. Laryngopharyngitis. The patient had Love fever after admission. In addition, the patient complained of sore throat, unable to take oral medications or food. We gave [employer]'s mouthwash, but the patient still has Love sore throat. We requested an ENT physician consult. Dr. Elenore Love did laryngoscopy which showed laryngopharyngitis but no other significant findings. He suggested to discontinue Spiriva and give Augmentin.  3. Acute renal failure, on chronic kidney disease. The patient was treated with gentle IV fluid support. BUN decreased to 32 and creatinine decreased to 1.62.  4. Leukocytosis. White count decreased to 5.8 after antibiotic treatment. 5. Anemia. The patient's hemoglobin decreased to 7.5, but is stable around 7.8. No active bleeding.  6. Diabetes. He has been treated with insulin 70/30, however, the dose was increased to 70 units twice Love day. Blood sugar has been controlled.  7. Hypertension. This has been controlled with hypertension medication, Toprol, but Lasix is on hold due to dehydration and hypotension.  8. Congestive heart failure. Stable, compensated.         The patient's vital signs are stable. No complaints. He is clinically stable and will be discharged to home with home health and physical therapy. I discussed the discharge plan with the patient, the patient's wife, case Production designer, theatre/television/filmmanager, and nurse.   TIME SPENT: About 40 minutes.  ____________________________ Craig PollackQing Roshunda Keir, MD qc:slb D: 09/02/2012 14:20:41 ET T: 09/02/2012 15:09:31 ET JOB#: [id number]  cc: Craig PollackQing Kaylan Yates, MD, <Dictator> Craig "Sallie" Allena KatzPatel, MD Craig PollackQING Trust Leh MD ELECTRONICALLY SIGNED 09/03/2012 22:17

## 2015-02-12 NOTE — Consult Note (Signed)
General Aspect lack of iv access    Present Illness The patient is a 69 yr old male with multiple medical problems of htn,DMII,CKD stage 3,admitted for septic shock with a urinary source.  He required transfer to the ICU 6/28 night due to respiratory distress and respiratory acidosis.  He was on bipap overnight and improved.  He was subsequently transferred to the floor.  He states he is feeling better to day, no chest pain, mild sob present.  He will require long term antibiotics and has very poor peripheral veins.  I am aske to secure IV access.  PAST MEDICAL HISTORY:  1. Patient was hospitalized in April 2013 with ESBL urinary tract infection.  2. Diabetes type 2.  3. Hypertension.  4. Morbid obesity.  5. Questionable transient ischemic attack.  6. Gastroesophageal reflux disease.  7. Obstructive sleep apnea.  8. History of coronary artery disease.  9. History of congestive heart failure, diastolic dysfunction with ejection fraction 55%.  10. Osteopenia.  11. Seborrheic dermatitis.   12. Diabetic neuropathy.  13. History of chronic kidney disease.   PAST SURGICAL HISTORY:  1. Carpal tunnel release.  2. Sounds like he had a recent TURP two days ago.   Home Medications: Medication Instructions Status  Advair Diskus 250 mcg-50 mcg inhalation powder 1 puff(s) inhaled 2 times a day Active  albuterol-ipratropium 2.5 mg-0.5 mg/3 mL inhalation solution 3 milliliter(s) inhaled every 4 hours, As Needed Active  Neurontin 300 mg oral capsule 1 cap(s) orally 2 times a day Active  risperidone 0.25 mg oral tablet 1 tab(s) orally 2 times a day Active  Sensipar 60 mg oral tablet 1 tab(s) orally 2 times a day Active  betamethasone dipropionate 0.05% topical cream 1 application topically 2 times a day, As Needed Active  Nitrostat 0.4 mg sublingual tablet 1 tab(s) sublingual every 5 minutes, As Needed- for Chest Pain  Active  Colace 100 mg oral capsule 2 cap(s) orally 2 times a day, As Needed- for  Constipation  Active  multivitamin 1 tab(s) orally once a day  Active  Tylenol 325 mg oral tablet 2 tab(s) orally 3 times a day, As Needed- for Pain Active  aspirin 81 mg oral tablet 1 tab(s) orally once a day  Active  MiraLax oral powder for reconstitution Mix one capful (17g) in 4-8oz of liquid once a day then increase to one capful twice a day Active  sertraline 100 mg oral tablet 1.5 tab(s) orally once a day Active  Plavix 75 mg oral tablet 1 tab(s) orally once a day  Active  atorvastatin 20 mg oral tablet 1 tab(s) orally once a day (in the evening) Active  Avodart 0.5 mg oral capsule 1 cap(s) orally once a day Active  Metoprolol Succinate ER 100 mg oral tablet, extended release 1 tab(s) orally once a day  Active  Novolin 70/30 subcutaneous suspension 60 unit(s) subcutaneous once a day (in the morning) Active  Novolin 70/30 subcutaneous suspension 50 unit(s) subcutaneous once a day (in the morning) Active  Spiriva 18 mcg inhalation capsule 1 cap(s) inhaled once a day Active  Voltaren Topical 1% topical gel Apply topically 2 grams to affected joints four times a day as needed  Active  ketoconazole 2% topical shampoo wash hair and eyebrows with shampoo three times a week Active  pantoprazole 40 mg oral enteric coated tablet 1 tab(s) orally once a day  Active  Vitamin D3 400 intl units oral tablet 2 tab(s) orally once a day Active  No Known Allergies:   Case History:   Family History Non-Contributory    Social History negative tobacco, negative ETOH, negative Illicit drugs   Review of Systems:   Fever/Chills No    Cough No    Sputum No    Abdominal Pain No    Diarrhea No    Constipation No    Nausea/Vomiting No    SOB/DOE Yes    Chest Pain No    Telemetry Reviewed NSR   Physical Exam:   GEN well developed, obese, disheveled    HEENT PERRL, hearing intact to voice, poor dentition    NECK supple  trachea midline    RESP normal resp effort  no use of accessory  muscles    CARD LE edema present  no JVD    ABD soft  nondistended; obese    EXTR negative cyanosis/clubbing, positive edema    SKIN No rashes, No ulcers    NEURO follows commands, motor/sensory function intact    PSYCH alert, A+O to time, place, person, good insight   Nursing/Ancillary Notes: **Vital Signs.:   30-Jun-13 06:24   Vital Signs Type Routine   Temperature Temperature (F) 98.9   Celsius 37.1   Temperature Source oral   Pulse Pulse 74   Pulse source per vital sign device   Respirations Respirations 21   Systolic BP Systolic BP 628   Diastolic BP (mmHg) Diastolic BP (mmHg) 68   Mean BP 84   BP Source vital sign device   Pulse Ox % Pulse Ox % 96   Pulse Ox Activity Level  At rest   Oxygen Delivery 2L   Pulse Ox Heart Rate 93   Telemetry pattern Cardiac Rhythm Normal sinus rhythm    08:00   Oxygen Delivery 2L; Nasal Cannula   Lab:  28-Jun-13 08:45    pH (ABG)  7.27   PCO2 44   PO2 92   FiO2 36   Base Excess  -6.6   HCO3  20.2   O2 Saturation 95.9   Specimen Site (ABG) RT RADIAL   Patient Temp (ABG) 37.0 (Result(s) reported on 17 Apr 2012 at 08:52AM.)  Routine Chem:  28-Jun-13 03:27    Glucose, Serum  302   Result Comment TROPONIN - RESULTS VERIFIED BY REPEAT TESTING.  - PREVIOUS CALL:04/16/12@1114 ..TPL  Result(s) reported on 17 Apr 2012 at 04:07AM.   BUN  42   Creatinine (comp)  2.75   Sodium, Serum  135   Potassium, Serum  5.3   Chloride, Serum 104   CO2, Serum 23   Calcium (Total), Serum  7.2   Anion Gap 8   Osmolality (calc) 292   eGFR (African American)  27   eGFR (Non-African American)  23 (eGFR values <46mL/min/1.73 m2 may be an indication of chronic kidney disease (CKD). Calculated eGFR is useful in patients with stable renal function. The eGFR calculation will not be reliable in acutely ill patients when serum creatinine is changing rapidly. It is not useful in  patients on dialysis. The eGFR calculation may not be applicable to  patients at the low and high extremes of body sizes, pregnant women, and vegetarians.)    22:14    Glucose, Serum  402 (Result(s) reported on 17 Apr 2012 at 10:50PM.)  Cardiac:  28-Jun-13 03:27    CK, Total  816   CPK-MB, Serum  3.9 (Result(s) reported on 17 Apr 2012 at 04:04AM.)   Troponin I  0.30 (0.00-0.05 0.05 ng/mL or less: NEGATIVE  Repeat testing in 3-6 hrs  if clinically indicated. >0.05 ng/mL: POTENTIAL  MYOCARDIAL INJURY. Repeat  testing in 3-6 hrs if  clinically indicated. NOTE: An increase or decrease  of 30% or more on serial  testing suggests a  clinically important change)  Routine Hem:  28-Jun-13 03:27    Hemoglobin (CBC)  8.2 (Result(s) reported on 17 Apr 2012 at 06:30AM.)    03:47    WBC (CBC)  11.7   RBC (CBC)  3.15   Hemoglobin (CBC)  8.2   Hematocrit (CBC)  25.7   Platelet Count (CBC)  94   MCV 82   MCH  25.9   MCHC  31.8   RDW  18.2   Neutrophil % 95.4   Lymphocyte % 2.6   Monocyte % 1.8   Eosinophil % 0.0   Basophil % 0.2   Neutrophil #  11.2   Lymphocyte #  0.3   Monocyte # 0.2   Eosinophil # 0.0   Basophil # 0.0 (Result(s) reported on 17 Apr 2012 at 09:07AM.)     Impression 1.  UTI/ESBL           need piccline and possibly discharge home with iv abx,d/c vanc, 2.  Acute on chronic renal Failure: stage 3             hold Ace inhibitors.monitor kidney function closely,renal dose all meds,UOP satisfactory  3.  Hematuria             s/p TURp:seen by  urology,no further recommendations 4.  DMII             uncontrolled due to steroids increase 70/30              sliding scale as needed 5.  OSA              cpap at bedtime    Plan level 3   Electronic Signatures: Hortencia Pilar (MD)  (Signed 30-Jun-13 14:45)  Authored: General Aspect/Present Illness, Home Medications, Allergies, History and Physical Exam, Vital Signs, Labs, Impression/Plan   Last Updated: 30-Jun-13 14:45 by Hortencia Pilar (MD)

## 2015-02-12 NOTE — Consult Note (Signed)
PATIENT NAME:  Craig Love, Kedrick A MR#:  [id number] DATE OF BIRTH:  05-08-1946  DATE OF CONSULTATION:  04/20/2012  REFERRING PHYSICIAN:  Dr. Sherryll BurgerShah  CONSULTING PHYSICIAN:  Rosalyn GessMichael E. Sinan Tuch, MD  REASON FOR CONSULTATION: ESBL producing Escherichia coli urinary tract infection following a laser prostatectomy.   HISTORY OF PRESENT ILLNESS: The patient is a 69 year old white man with a history of diabetes, ESBL producing Escherichia coli urinary tract infection in April of 2013, benign prostatic hypertrophy status post recent laser prostatectomy on 06/25 who was admitted on 06/27 after having fallen with an inability to get up and fever. The patient had undergone his laser prostatectomy without problem and had his catheter removed the next day and was sent home. He was at home and went to sleep and then in the morning he woke up on the floor unable to get up. He was found to be febrile in the Emergency Room and was admitted to the hospital. He says he has been having some chills and sweats. He has had some minimal hematuria following his procedure but has not had significant dysuria or increased frequency. He denies any nausea or vomiting. He has not had any back pain. His urine cultures are growing ESBL producing Escherichia coli. He was initially treated with Zosyn in the Emergency Room and then given ertapenem and vancomycin. The vancomycin was stopped once the cultures returned. He continues on ertapenem.   ALLERGIES: None.   PAST MEDICAL HISTORY:  1. Diabetes.  2. ESBL urinary tract infection in April of 2013.  3. Hypertension.  4. Possible TIA.  5. Gastroesophageal reflux disease.  6. Obstructive sleep apnea.  7. Coronary artery disease.  8. Congestive heart failure.  9. Neuropathy related to his diabetes.  10. Chronic renal insufficiency presumably related to his diabetes.  11. Benign prostatic hypertrophy status post laser prostatectomy.   SOCIAL HISTORY: The patient lives at home with his wife.  He has a dog at home. He does not smoke. He does not drink. No injecting drug use history.   FAMILY HISTORY: Positive for stroke, coronary artery disease, diabetes, and hypertension.   REVIEW OF SYSTEMS: GENERAL: Positive fevers, chills, sweats, and malaise. HEENT: No headaches. No sinus congestion. No sore throat. NECK: No stiffness. No swollen glands. RESPIRATORY: He had some cough that is dry. Occasional shortness of breath. No significant sputum production. CARDIAC: He had some chest pains while in the hospital but this has improved. He has no peripheral edema. GI: No nausea, no vomiting, no abdominal pain, no change in his bowels. GU: Some hematuria following his procedure but no dysuria. No increased frequency. No hesitancy. MUSCULOSKELETAL: He has generalized weakness but no focal muscle or joint complaints. SKIN: No rashes. NEUROLOGIC: No focal weakness. PSYCHIATRIC: No complaints.   PHYSICAL EXAMINATION:   VITAL SIGNS: T-max of 101.4 in the Emergency Room, T-current of 97.9, pulse 71, blood pressure 132/71, 97% on 2 liters.   GENERAL: Obese 69 year old white man in no acute distress.   HEENT: Normocephalic, atraumatic. Pupils equal and reactive to light. Extraocular motion intact. Sclerae without evidence for emboli or petechiae. Oropharynx shows no erythema or exudate. Teeth and gums are in fair condition.   NECK: Supple. Full range of motion. Midline trachea. No lymphadenopathy. No thyromegaly.   CHEST: Clear to auscultation bilaterally with good air movement. No focal consolidation.   CARDIAC: Regular rate and rhythm without murmur, rub, or gallop. Distant S1 and S2.   GI: Soft, nontender, nondistended. No hepatosplenomegaly. No hernia is  noted. No CVA tenderness.    MUSCULOSKELETAL: No evidence for tenosynovitis. No cyanosis, clubbing, or edema.   SKIN: No rashes. No stigmata of endocarditis, specifically no Janeway lesions or Osler nodes.   NEUROLOGIC: The patient was awake  and interactive, moving all four extremities.   PSYCHIATRIC: Mood and affect appeared normal.   LABORATORY DATA: BUN 41, creatinine 1.74, bicarbonate 27, anion gap 6. LFTs were unremarkable on admission. His white count on 06/28 was 11.7 with hemoglobin 8.2, platelet count 94, ANC 11.2. White count on admission was 15.1. His white count has not been repeated since admission. Blood cultures from admission show no growth. A urinalysis had 3+ blood, 100 mg/dL protein, negative nitrites, 2+ leukocyte esterase, 157 red cells, and 3209 white cells. Urine culture is growing greater than 100,000 CFUs/mL of ESBL producing Escherichia coli.   A chest x-ray from admission showed no acute cardiopulmonary disease.   Bilateral renal ultrasound showed no hydronephrosis.   IMPRESSION: This is a 69 year old white man with history of diabetes, ESBL producing Escherichia coli in 01/2012, benign prostatic hypertrophy status post laser prostatectomy on 06/25 who was admitted with postprocedural ESBL producing Escherichia coli urinary tract infection.   RECOMMENDATIONS:  1. I agree with the ertapenem. Blood cultures are currently negative.  2. PICC line is in place.  3. Would treat for 14 days of IV therapy.  4. I will plan on seeing him in two weeks as an outpatient.  5. Will repeat his CBC in the morning.  6. Would continue contact isolation for his resistant gram-negative rod.   Thank you very much for involving me in Mr. Tinnel' care.  ____________________________ Rosalyn Gess. Jaine Estabrooks, MD meb:drc D: 04/20/2012 15:51:17 ET T: 04/20/2012 16:27:00 ET JOB#: [id number]  cc: Rosalyn Gess. Shannara Winbush, MD, <Dictator> Shiv Shuey E Rosalie Gelpi MD ELECTRONICALLY SIGNED 04/22/2012 8:08

## 2015-02-12 NOTE — H&P (Signed)
PATIENT NAME:  Craig Love, Craig Love MR#:  [id number] DATE OF BIRTH:  1946-08-16  DATE OF ADMISSION:  01/22/2012  PRIMARY CARE PHYSICIAN: Dr. Maryruth HancockSallie Patel    CHIEF COMPLAINT: Shortness of breath and not feeling good.   HISTORY OF PRESENT ILLNESS: This is Love 69 year old male who has Love history of frequent UTIs. He was recently treated for one back in February. He comes in after having some cough and shortness of breath starting Monday, no fever. He has been having burning with urination. He has not been urinating very frequently lately. He has not been eating well according to his family. He comes in this evening with acute respiratory symptoms, breathing 32 times Love minute, having some wheezing. Here he was found to have another urinary tract infection so we are going to admit him for further treatment.   PAST MEDICAL HISTORY:  1. Frequent UTIs.  2. Stage III chronic kidney disease.  3. Lower extremity edema.  4. History of cerebrovascular accident.  5. Chronic obstructive pulmonary disease.  6. Osteoarthritis.  7. Depression.  8. Gout.  9. Diabetic nephropathy.  10. Carpal tunnel syndrome.  11. History of congestive heart failure with diastolic dysfunction, EF of 55%.  12. Coronary artery disease.  13. Obstructive sleep apnea.  14. Acid reflux disease.  15. Morbid obesity.  16. Hypertension.  17. Insulin-dependent diabetes.  18. Chronic respiratory failure with home oxygen at 2 liters.   PAST SURGICAL HISTORY: None.  ALLERGIES: Accupril and ACE inhibitors.   CURRENT MEDICATIONS:  1. Quinapril 10 mg daily.  2. Vitamin D 400 units daily.  3. Lasix 20 mg daily, currently on hold. 4. Avodart 0.5 mg daily.  5. Neurontin 300 mg b.i.d.  6. Protonix 40 mg daily.  7. Plavix 75 mg daily.  8. Metoprolol XL 100 mg daily.  9. Lipitor 20 mg daily.  10. Zoloft 150 mg daily.  11. Risperidone 0.25 mg b.i.d.  12. Sensipar 60 mg b.i.d.  13. Flomax 0.4 mg daily.  14. Spiriva 1 puff daily.   15. Advair 250/50 one puff b.i.d.  16. Novolin 70/30 60 units q.Love.m. and 50 units q.p.m.  17. Colace 100 mg b.i.d.  18. Multivitamin daily.  19. Aspirin 81 mg daily.  20. MiraLAX 17 grams daily p.r.n.  21. O2 2 liters nasal cannula.  22. Albuterol nebs q.4 hours p.r.n.   SOCIAL HISTORY: Does not smoke or drink alcohol.   FAMILY HISTORY: Significant for diabetes and hypertension.   REVIEW OF SYSTEMS: CONSTITUTIONAL: He's had some fever. EYES: No blurred vision. ENT: No hearing loss. CARDIOVASCULAR: No chest pain. PULMONARY: Some shortness of breath. GI: No nausea, vomiting, or diarrhea. GU: Some dysuria. ENDOCRINE: No heat or cold intolerance. INTEGUMENTARY: No rash. MUSCULOSKELETAL: Occasional joint pain. NEUROLOGIC: No numbness or weakness.     PHYSICAL EXAMINATION:   VITAL SIGNS: Temperature 99.4, pulse 110, respirations 34, blood pressure 156/110.   GENERAL: This is an obese white male who appears ill in some moderate respiratory distress.   HEENT: The pupils are equal, round, and reactive to light. Sclerae nonicteric. Oral mucosa is dry. Oropharynx is clear. Nasopharynx is clear.   NECK: Supple. No JVD, lymphadenopathy, or thyromegaly.   CARDIOVASCULAR: Tachy with no murmurs.   LUNGS: There are some coarse scattered rhonchi, some mild expiratory wheezing. No dullness to percussion. He is using some accessory muscles.   ABDOMEN: Obese, nontender, nondistended. Bowel sounds are positive. No hepatosplenomegaly. No masses.   EXTREMITIES: There is 1+ lower extremity edema.  NEUROLOGIC: Cranial nerves II through XII are intact. He is alert and oriented x4.   SKIN: Moist with no rash.   LABORATORY, DIAGNOSTIC, AND RADIOLOGICAL DATA: Chest x-ray shows no acute disease, difficult to read because of his obesity.   Urinalysis shows 3+ leukocyte esterase, 3+ bacteria, 300 white blood cells per high-power field. ABG shows pH 7.41, pCO2 35, pO2 67. White blood cells 9.5, hemoglobin  8.6, BUN 35, creatinine 2.5, sodium 134.   ASSESSMENT AND PLAN:  1. Acute on chronic respiratory failure. Again, his chest x-ray is difficult to read. He does have Love urinary tract infection. He may also have pneumonia. Will go ahead and cover him with antibiotics and get cultures. Give him O2 support. Frequent nebs if needed.  2. Acute renal failure. His creatinine is up. I suspect this may be some mild dehydration. I will go ahead and give him IV fluids, renal dose medications, see how this responds.  3. Urinary tract infection. He has had an ESBL in the not too distant past so will go ahead and add Invanz to his coverage and get cultures.  4. SIRS. He does have diagnostic criteria for SIRS if not sepsis at this point. Will go ahead and treat him aggressively.  5. Chronic anemia. Will check old records to see if this has been worked up. His hemoglobin is about at baseline at 8.6 so will not transfuse at this point.  TOTAL CRITICAL CARE TIME SPENT: 60 minutes.   ____________________________ Gracelyn Nurse, MD jdj:drc D: 01/22/2012 20:43:12 ET T: 01/23/2012 06:51:50 ET JOB#: [id number]  cc: Gracelyn Nurse, MD, <Dictator> Sarah "Sallie" Allena Katz, MD Gracelyn Nurse MD ELECTRONICALLY SIGNED 01/26/2012 23:58

## 2015-02-12 NOTE — Consult Note (Signed)
Pt seen;  It appears the bacteria was likely resistant to the post op antibiotic.cultures pendinglooks good at this stage post TURpain from prostate.  No complaint at present.not seen on ultrasound due to body habitus.placement cant be completly confirmed but suspect in proper position.per culture.  OK to remove foley when ready.PVR excellent.other intervention indicated at present.  F/U as scheduled.follow.  Electronic Signatures: Smith Robertope, Adeline Petitfrere S (MD)  (Signed on 28-Jun-13 15:37)  Authored  Last Updated: 28-Jun-13 15:37 by Smith Robertope, Kessler Solly S (MD)

## 2015-02-12 NOTE — Op Note (Signed)
PATIENT NAME:  Craig FassSUGGS, Abdulkadir A MR#:  [id number] DATE OF BIRTH:  [birth year]  DATE OF PROCEDURE:  04/20/2012  PREOPERATIVE DIAGNOSES:  1. Urinary tract infection/ESBL requiring long-term IV antibiotics. 2. Renal insufficiency. 3. Hematuria.  POSTOPERATIVE DIAGNOSES:  1. Urinary tract infection/ESBL requiring long-term IV antibiotics. 2. Renal insufficiency. 3. Hematuria.  PROCEDURES:  1. Ultrasound guidance for vascular access to right basilic vein.  2. Fluoroscopic guidance for placement of catheter.  3. Insertion of peripherally inserted central venous catheter, right arm.  SURGEON: Annice NeedyJason S. Dew, MD   ANESTHESIA: Local.   ESTIMATED BLOOD LOSS: Minimal.   INDICATION FOR PROCEDURE: Morbidly obese 69 year old male admitted with complicated urinary tract infection requiring extended IV antibiotics needing long-term IV antibiotics. PICC line is requested.    DESCRIPTION OF PROCEDURE: The patient's right arm was sterilely prepped and draped, and a sterile surgical field was created. The right basilic vein was accessed under direct ultrasound guidance without difficulty with a micropuncture needle and permanent image was recorded. 0.018 wire was then placed into the superior vena cava. Peel-away sheath was placed over the wire. A single lumen peripherally inserted central venous catheter was then placed over the wire and the wire and peel-away sheath were removed. The catheter tip was placed into the superior vena cava and was secured at the skin at 48 cm using a dual lumen PICC line with a sterile dressing.   The catheter withdrew blood well and flushed easily with heparinized saline. The patient tolerated procedure well.  ____________________________ Annice NeedyJason S. Dew, MD jsd:drc D: 04/20/2012 14:48:52 ET T: 04/20/2012 15:35:03 ET JOB#: [id number]  cc: Annice NeedyJason S. Dew, MD, <Dictator> Annice NeedyJASON S DEW MD ELECTRONICALLY SIGNED 04/22/2012 10:31

## 2015-02-12 NOTE — H&P (Signed)
PATIENT NAME:  Craig Love, Craig Love MR#:  [id number] DATE OF BIRTH:  1946/04/12  DATE OF ADMISSION:  04/16/2012  PRIMARY CARE PHYSICIAN: Dr. Hillery Aldo  CHIEF COMPLAINT: Fall, weakness and fevers.   HISTORY OF PRESENT ILLNESS: 69 year old male with a history of hypertension, diastolic congestive heart failure, stage III chronic kidney disease, chronic obstructive pulmonary disease that should be on oxygen but just wears it p.r.n. who presents with above complaint. Patient had a laser surgical procedure for his prostate about two days ago. Since that time he has been feeling fine. Last night he slipped out of his bed and was unable to get up all night. He called for his wife and she didn't find him until the morning when she called EMS. He was noted to have a fever of 101.4 in the Emergency Room with hospitalist service was consulted for further evaluation and management. Patient himself does not have any complaints except for some weakness. He is usually ambulatory with a walker, however, he felt so weak he was just unable to get off the floor apparently.   REVIEW OF SYSTEMS: CONSTITUTIONAL: Positive fever. No fatigue, weakness. EYES: No blurry or double vision. ENT: No ear pain. Positive snoring. Positive mild hearing loss. . RESPIRATORY: Positive chronic cough. No wheezing. Positive history of chronic obstructive pulmonary disease, supposed to be on oxygen. No hemoptysis. No pneumonia or dyspnea. CARDIOVASCULAR: No chest pain, orthopnea, edema, arrhythmia, dyspnea on exertion, palpitations, syncope. GASTROINTESTINAL: No nausea, vomiting, diarrhea, abdominal pain, melena or ulcers. GENITOURINARY: Patient had a recent laser surgical procedure for enlarged prostate. Sounds like it was a transurethral resection of the prostate. He now has a Foley in placed by the ER. ENDOCRINE: No polyuria, polydipsia.  HEME/LYMPH: Positive anemia. Positive easy bruising. SKIN: No rash or lesions. MUSCULOSKELETAL: Limited activity  due to body habitus. He walks with a walker. NEUROLOGICAL: No history of numbness, weakness, vertigo. He denies any history of CVAs, although the patient's medical chart says he had a cerebrovascular accident. PSYCH: No history of anxiety, depression.   PAST MEDICAL HISTORY:  1. Patient was hospitalized in April 2013 with ESBL urinary tract infection.  2. Diabetes type 2.  3. Hypertension.  4. Morbid obesity.  5. Questionable transient ischemic attack.  6. Gastroesophageal reflux disease.  7. Obstructive sleep apnea.  8. History of coronary artery disease.  9. History of congestive heart failure, diastolic dysfunction with ejection fraction 55%.  10. Osteopenia.  11. Seborrheic dermatitis.   12. Diabetic neuropathy.  13. History of chronic kidney disease.   PAST SURGICAL HISTORY:  1. Carpal tunnel release.  2. Sounds like he had a recent TURP two days ago.   SOCIAL HISTORY: No tobacco, alcohol, or drug use.   ALLERGIES: Accupril and ACE inhibitor.    FAMILY HISTORY: Positive for diabetes, hypertension. Father had a heart attack at age 41. Mother had a stroke at age 66.   MEDICATIONS:  1. Advair Diskus 250/50 b.i.d.  2. Aspirin 81 mg daily.  3. Albuterol ipratropium 3 mL q.4 hours p.r.n.  4. Atorvastatin 20 mg daily.  5. Avodart 0.5 mg daily.  6. Dexamethasone 1 application b.i.d.  7. Colace 100 mg 2 tablets b.i.d. p.r.n.  8. Ketoconazole 2% topical shampoo.  9. Metoprolol 100 mg daily.  10. MiraLax 1 capful daily.  11. Multivitamin 1 tablet daily.  12. Neurontin 300 mg b.i.d.  13. Nitrostat 0.4 mg sublingual p.r.n. chest pain.  14. Novolin 70/30, 50 units in the morning and 60 units in  the evening.  15. Pantoprazole 40 mg daily.  16. Plavix 75 mg daily.  17. Risperdal 0.25 mg b.i.d.  18. Sensipar 60 mg b.i.d.  19. Zoloft 200 mg 1-1/2 tablets daily.  20. Spiriva 18 mcg daily.  21. Tylenol 325, 2 tablets t.i.d. p.r.n.  22. Vitamin D3 400 international units 2 tablets  daily.  23. Voltaren topical 1% topical gel b.i.d. p.r.n.   PHYSICAL EXAMINATION:  VITAL SIGNS: Temperature 100.2 apparently when he arrived in the Emergency Room was 101.4 according to Dr. Enedina Finner, pulse 114, respirations 20, blood pressure 108/50, 93% on room air.   GENERAL: Patient is alert, oriented, does not appear to be in acute distress.   HEENT: Head is atraumatic. Pupils are round, reactive. Sclerae anicteric. Mucous membranes are dry. Oropharynx is clear.   NECK: Short. Unable to appreciate any kind of thyromegaly or carotid bruit due to the short neck.   CARDIOVASCULAR: Distant heart sounds. No appreciable murmurs, gallops, rubs.   ABDOMEN: Morbidly obese. I can't actually here his bowel sounds. Hard to appreciate organomegaly.   EXTREMITIES: No clubbing, cyanosis, edema.   LUNGS: Clear to auscultation without crackles, rales, rhonchi or wheezing. Normal to percussion.   BACK: No CVA or vertebral tenderness.   NEUROLOGICAL: Cranial nerves II through XII are intact. No focal deficits.   STRENGTH: He has 4/5 upper and lower extremity strength. He is able to move all extremities. Hard to get good reflexes on him.   LABORATORY, DIAGNOSTIC AND RADIOLOGICAL DATA: Sodium 137, potassium 4.5, chloride 105, bicarbonate 25, BUN 32, creatinine 2.32, glucose 207, calcium 8.0, total protein 6.4, albumin 2.8, bilirubin 0.3, alkaline phosphatase 92, AST 36, ALT 25, CK 358, CPK-MB 1.9. Troponin 0.55, white blood cells 15, hemoglobin 9.2, hematocrit 29.1, platelets 137.   INR 1.1.   Chest x-ray shows no acute cardiopulmonary disease.   Urinalysis pending.   EKG: It is read as atrial fibrillation but I don't think it is atrial fibrillation, looks like SVT.   ASSESSMENT AND PLAN: 69 year old male with a history of chronic obstructive pulmonary disease, wears oxygen p.r.n., hypertension, morbid obesity, diastolic heart failure who presents after a fall with inability to get up and was found  incidentally to have a fever and sepsis.  1. Sepsis with tachycardia, fever, some mild relative hypotension. Patient was here in April with ESBL urinary tract infection. Chest x-ray looks clear. Urinalysis is still pending. Will go ahead and treat empirically with imipenem per his sensitivities last hospitalization. He had a recent surgical procedure for his prostate, sounds like it could be a TURP. I do not know if this is related or not, but we will monitor. Blood cultures, urine cultures have been order in the Emergency Room.  2. Acute renal failure on chronic renal failure, likely due to dehydration, however, due to the recent surgical procedure will go ahead and order a kidney ultrasound as well as hydrate the patient. Hold any nephrotoxic agents.  3. Diabetes. Continue insulin sliding scale and ADA diet.  4. Depression. Continue with Risperdal and Zoloft.  5. Hypertension. Will continue with metoprolol.  6. Questionable history of stroke versus transient ischemic attack. Patient is on Plavix and atorvastatin, which we will continue.  7. Chronic obstructive pulmonary disease. Will continue inhalers and O2 p.r.n.  8. Elevated troponin of unclear etiology, probably from poor renal clearance. Follow the cardiac enzymes. Place the patient on telemetry.  9. Mild thrombocytopenia. Will monitor carefully, likely secondary to sepsis. Patient will be on heparin sub-Q  for deep vein thrombosis prophylaxis.  10. CODE STATUS: Patient is DO NOT RESUSCITATE status.     TIME SPENT: Approximately 55 minutes.   ____________________________ Janyth ContesSital P. Juliene PinaMody, MD spm:cms D: 04/16/2012 12:47:54 ET T: 04/16/2012 13:41:23 ET JOB#: [id number]  cc: Debbe Crumble P. Juliene PinaMody, MD, <Dictator> Sarah "Maryruth HancockSallie" Patel, MD Janyth ContesSITAL P Anaiyah Anglemyer MD ELECTRONICALLY SIGNED 04/16/2012 16:30

## 2015-02-12 NOTE — Discharge Summary (Signed)
PATIENT NAME:  Craig Love, Craig Love MR#:  [id number] DATE OF BIRTH:  11/23/1945  DATE OF ADMISSION:  04/16/2012 DATE OF DISCHARGE:  04/21/2012  DISCHARGE DIAGNOSES:  1. Septic shock likely present on admission, likely due to extended-spectrum beta-lactamase urinary tract infection on ertapenem.  2. Acute on chronic respiratory failure, on steroids, improving. 3. Acute on chronic renal failure, stage III, close to baseline. 4. Hematuria status post transurethral resection of the prostate, seen by urology.  5. Diabetes type 2, uncontrolled blood sugar due to steroids. 6. Obstructive sleep apnea on continuous positive airway pressure at bedtime.  7. Mild hyperkalemia, resolved.  8. Non-ST elevation myocardial infarction likely due to demand ischemia due to sepsis with echocardiogram showing normal left ventricular function.  9. Chronic well compensated diastolic heart failure with ejection fraction more than 55%.   SECONDARY DIAGNOSES:  1. Type 2 diabetes.  2. Hypertension.  3. Morbid obesity.  4. History of transient ischemic attack. 5. Gastroesophageal reflux disease.  6. Obstructive sleep apnea.  7. Coronary artery disease.  8. Diastolic heart failure.  9. Diabetic neuropathy. 10. History of chronic kidney disease stage II to III.    CONSULTANTS:  1. Urology, Dr. Achilles Dunkope.  2. Vascular surgery, Dr. Wyn Quakerew.  3. Infectious disease, Dr. Leavy CellaBlocker.   LABORATORY, DIAGNOSTIC AND RADIOLOGICAL DATA:  PICC line placement by Dr. Wyn Quakerew on 07/01.   Chest x-ray on 06/27 showed no acute cardiopulmonary disease. Bilateral kidney ultrasound on 06/27 showed no hydronephrosis, otherwise normal exam.   Urinalysis on admission showed WBC in clumps, 3+ bacteria, 3209 WBCs, 2+ leukocyte esterase. Urine culture grew more than 100,000 colonies of ESBL.   Blood cultures x2 were negative.    HISTORY AND SHORT HOSPITAL COURSE: Patient is Love 69 year old male with above-mentioned medical problems was admitted for septic  shock due to urinary tract infection. He was placed in Critical Care Unit. Started on BiPAP as he was also having acute on chronic respiratory failure, started on steroids, nebulizer breathing treatment along with pressors for hypotension. Please see Dr. Camillia HerterMody's dictated history and physical for further details. Patient was evaluated by urology, Dr. Assunta GamblesBrian Cope, as he had recent transurethral resection of the prostate and did not have any further recommendations. His renal ultrasound was within normal limits. He was found to have ESBL urinary tract infection for which infectious disease consult was obtained with Dr. Leavy CellaBlocker who recommended ertapenem and PICC line placement for at least two weeks of therapy. Patient was slowly improving with IV antibiotics. Was evaluated by speech therapy and physical therapy and was getting therapy while in the hospital, was slowly improving. Family and the patient was firm on going back home only with home health services. On 07/02 he was close to baseline, was discharged home in stable condition. Patient was found to have non-ST elevation myocardial infarction based on cardiac biomarkers which was thought to be due to supply/demand ischemia. His troponin peaked at 0.58 which was thought to be likely due to sepsis.   PHYSICAL EXAMINATION: VITAL SIGNS: On the date of discharge, his vital signs are as follows: Temperature 98.1, heart rate 66 per minute, respirations 22 per minute, blood pressure 154/78 mmHg. He was saturating 98% on room air. Pertinent Physical Examination on the date of discharge: CARDIOVASCULAR: S1, S2 normal. No murmur, rales or gallops. LUNGS: Decreased breath sounds at the bases. No wheezing, rales, rhonchi, crepitation. ABDOMEN: Soft, obese, benign, midline well-healed surgical scar. NEUROLOGIC: Nonfocal examination. All other physical examination remained at the baseline.  DISCHARGE MEDICATIONS:  1. Advair 250/50, 1 puff b.i.d.  2. DuoNeb inhaled q.4  hours as needed.   3. Neurontin 300 mg p.o. b.i.d.  4. Risperidone 0.25 mg p.o. b.i.d.  5. Sensipar 60 mg p.o. b.i.d.  6. Betamethasone 0.05% topical cream twice Love day as needed.  7. Nitrostat 0.4 mg sublingual every five minutes as needed.  8. Colace 100 mg 2 capsules p.o. b.i.d. as needed. 9. Multivitamin once daily. 10. Tylenol 650 mg p.o. 3 times Love day as needed.  11. Aspirin 81 mg p.o. daily.  12. MiraLax once daily and then increase to 1 cap full twice Love day. 13. Sertraline 100 mg 1.5 tablets p.o. daily.  14. Plavix 75 mg p.o. daily. 15. Lipitor 20 mg p.o. daily.  16. Avodart 0.5 mg p.o. daily.  17. Metoprolol ER 100 mg p.o. daily. 18. Spiriva once daily.  19. Voltaren gel 1% topically 2 grams to affected joints q.i.d. as needed.  20. Ketoconazole 2% topical shampoo for washing hair and eyebrows three times Love week.  21. Protonix 40 mg p.o. daily. 22. Protonix 40 mg p.o. daily.  23. Vitamin D3 400 international units 2 tablets p.o. daily.  24. Novolin 70/30, 60 units sub-Q b.i.d.  25. Prednisone 60 mg p.o. daily, taper 10 mg daily until finished.  26. Invanz injection 1 gram daily via PICC line for nine more days.   DISCHARGE DIET: Low sodium, 1800 ADA mechanical soft with thin liquids. Aspiration precautions. Medication in puree as necessary.   DISCHARGE ACTIVITY: As tolerated.   DISCHARGE INSTRUCTIONS AND FOLLOW UP:  1. Patient instructed to follow up with his primary care physician, Dr. Hillery Aldo, in 1 to 2 weeks. He will need follow up with Dr. Casimiro Needle Blocker in 2 to 4 weeks. He was set up to get home health with physical therapy and nursing. He will need follow up with urologist, Dr. Assunta Gambles, next Wednesday as scheduled on 07/10. He was also instructed to get CBC, basic metabolic panel check in one week with results forwarded to primary care physician.  2. PICC line care per protocol.     TOTAL TIME DISCHARGING THIS PATIENT: 55 minutes.    ____________________________ Ellamae Sia. Sherryll Burger, MD vss:cms D: 04/22/2012 08:52:32 ET T: 04/22/2012 15:03:06 ET JOB#: [id number]  cc: Trajan Grove S. Sherryll Burger, MD, <Dictator> Sarah "Sallie" Allena Katz, MD Rosalyn Gess. Blocker, MD Madolyn Frieze. Achilles Dunk, MD Patricia Pesa MD ELECTRONICALLY SIGNED 04/24/2012 19:45

## 2015-02-12 NOTE — Consult Note (Signed)
Pt discussed with [facility]:WRUEoc:with Hx of BPH on maximal medicationwith retention and acute on chronic renal failure, CHFdue to Retention. no hydro but done day after cath placement with >85000ml drained.will need foley 7-10 days with F/U office for void trial and cysto.will likely need TURP in near future.called to give F/U to clerk.formal consult needed.if any further questions.  Electronic Signatures: Smith Robertope, Aspin Palomarez S (MD)  (Signed on 04-Apr-13 15:15)  Authored  Last Updated: 04-Apr-13 15:15 by Smith Robertope, Quintavia Rogstad S (MD)

## 2015-02-12 NOTE — Consult Note (Signed)
Impression: 69yo WM w/ h/o DM, ESBL producing E. coli UTI in 01/2012, BPH, s/p laser prostatectomy on 6/25 admitted with post procedure ESBL producing E. coli UTI.  Recommendations: Agree with ertapenem.  Blood cultures are negative.  PICC line has been placed. Would treat for 14 days of IV therapy. I will see in 2 weeks as an outpatient. Will repeat his CBC in am. 6) Continue contact isolation.   Electronic Signatures: Shanedra Lave, Rosalyn GessMichael E (MD) (Signed on 01-Jul-13 15:41)  Authored   Last Updated: 01-Jul-13 15:51 by Anik Wesch, Rosalyn GessMichael E (MD)

## 2015-02-12 NOTE — Op Note (Signed)
PATIENT NAME:  Craig Love, Craig Love MR#:  [id number] DATE OF BIRTH:  25-May-1946  DATE OF PROCEDURE:  04/27/2012  PREOPERATIVE DIAGNOSES:   1. Complicated urinary tract infection requiring long-term IV antibiotics. 2. Poor venous access.  3. Morbid obesity.   POSTOPERATIVE DIAGNOSES:  1. Complicated urinary tract infection requiring long-term IV antibiotics. 2. Poor venous access.  3. Morbid obesity.   PROCEDURES:  1. Ultrasound guidance for vascular access to left basilic vein.  2. Fluoroscopic guidance for placement of catheter.  3. Insertion of peripherally inserted central venous catheter, left arm.  SURGEON: Annice NeedyJason S. Dew, MD  ANESTHESIA: Local.   ESTIMATED BLOOD LOSS: Minimal.   INDICATION FOR PROCEDURE: Morbidly obese white male who has Love complicated urinary tract infection requiring IV antibiotics for longer than five days. He had Love previous PICC line that somehow came out. He has very poor venous access. Another PICC line is needed.   DESCRIPTION OF PROCEDURE: The patient's left arm was sterilely prepped and draped, and Love sterile surgical field was created. The left basilic vein was accessed under direct ultrasound guidance without difficulty with Love micropuncture needle and permanent image was recorded. 0.018 wire was then placed into the superior vena cava. Peel-away sheath was placed over the wire. Love single lumen peripherally inserted central venous catheter was then placed over the wire and the wire and peel-away sheath were removed. The catheter tip was placed into the superior vena cava and was secured at the skin at 49 cm with Love sterile dressing. The catheter withdrew blood well and flushed easily with heparinized saline.   The patient tolerated procedure well.  ____________________________ Annice NeedyJason S. Dew, MD jsd:cms D: 04/27/2012 16:19:25 ET T: 04/28/2012 08:32:24 ET JOB#: [id number]  cc: Annice NeedyJason S. Dew, MD, <Dictator>  Annice NeedyJASON S DEW MD ELECTRONICALLY SIGNED 04/30/2012 13:46

## 2015-02-12 NOTE — Op Note (Signed)
PATIENT NAME:  Craig Love, Craig Love MR#:  [id number] DATE OF BIRTH:  1946-09-29  DATE OF PROCEDURE:  04/14/2012  PREOPERATIVE DIAGNOSES:  1. Benign prostatic hypertrophy.  2. Urinary retention.   POSTOPERATIVE DIAGNOSES:  1. Benign prostatic hypertrophy.  2. Urinary retention.   PROCEDURE: KTP laser prostatectomy.   SURGEON: Assunta GamblesBrian Eiliana Drone, MD   ANESTHESIA: General endotracheal anesthesia.   INDICATIONS: The patient is Love 69 year old African American gentleman who presented recently to the Emergency Room and hospital with acute urinary retention and urosepsis. He has failed multiple voiding trials and medical management. He has been noted to have Love high-riding bladder neck and moderate prostatic hypertrophy. He presents today for KTP laser prostatectomy.   PROCEDURE: After informed consent was obtained, the patient was taken to the Operating Room and placed in the dorsal lithotomy position under general endotracheal anesthesia. The patient was then prepped and draped in the usual standard fashion. The KTP laser cystoscope was placed utilizing the visual obturator without difficulty. The urethra demonstrated no significant abnormalities. Upon entering the prostatic fossa, the verumontanum was easily identified. The Foley catheter had been noted to be pulled into the prostatic urethra with significant compression and opening of the prostate fossa. No visual obstruction was noted from the verumontanum to the bladder neck. Moderate inflammatory changes were noted within the prostate fossa. The bladder was then inspected in its entirety with no gross mucosal lesions noted. Mild inflammatory changes were noted on the bladder neck. Bilateral ureteral orifices were well visualized. They were noted to be an adequate distance from the bladder neck region. The KTP laser fiber was inserted through the scope. An incision was made at the 6:00 position through the bladder neck to the level of the trigone. This was taken back  to the level of the verumontanum. The intervening bilobar tissue was then taken down utilizing the KTP laser fiber. The initial resection was undertaken at 60 watts of power. Once an adequate channel was obtained, the wattage was increased to 120 watts with additional lateral lobe tissue resected back to the level of the verumontanum. The patient's anatomy was noted to be somewhat abnormal with the verumontanum well visualized with moderate apical tissue present causing some persistent visual obstruction despite the resection. The decision was made not to proceed with any further resection beyond the level of the verumontanum. Some anterior tissue was present. No significant bleeding was encountered. Prominent stones were noted at the 9:00 and 3:00 positions which were somewhat atypical in terms of location. These were freed from the prostate tissue and pushed into the bladder. Once an adequate fossa was obtained, the KTP laser scope was removed. Love 20-French Foley catheter was then placed to gravity drainage without difficulty. The patient was returned to the supine position. He was awakened from general endotracheal anesthesia and was taken to the recovery room in stable condition. There were no problems or complications. The patient tolerated the procedure well.   ESTIMATED BLOOD LOSS: Minimal.  ____________________________ Madolyn FriezeBrian S. Achilles Dunkope, MD bsc:cbb D: 04/14/2012 22:04:48 ET T: 04/15/2012 10:06:54 ET JOB#: [id number]  cc: Madolyn FriezeBrian S. Achilles Dunkope, MD, <Dictator> Madolyn FriezeBRIAN S Muntaha Vermette MD ELECTRONICALLY SIGNED 04/16/2012 0:40

## 2015-02-12 NOTE — Discharge Summary (Signed)
PATIENT NAME:  Craig Craig Love, Craig Craig Love MR#:  [id number] DATE OF BIRTH:  [birth year]  DATE OF ADMISSION:  01/22/2012 DATE OF DISCHARGE:  01/28/2012  DISCHARGE DIAGNOSES:  1. Acute on chronic respiratory failure secondary to chronic obstructive pulmonary disease exacerbation.  2. Acute and chronic renal failure, CKD stage III, obstructive uropathy status post Foley catheter.  3. E coli  urinary tract infection with extended spectrum beta-lactamase. 4. Diabetes. 5. Hypertension. 6. Obstructive sleep apnea, on CPAP at bedtime.  7. History of cerebrovascular accident.  8. Coronary artery disease. 9. Morbid obesity. 10. History of diastolic failure, chronic. 11. Depression. 12. Osteoarthritis.  CONSULTATION: Urology, Dr. Nunzio Cobbsope    HOSPITAL COURSE: The patient is Craig Love 69 year old male who has history of diabetes, hypertension, hyperlipidemia, coronary artery disease, and history of cerebrovascular accident. The patient has had frequent UTIs in the past. Last year he had extended spectrum beta-lactamase Klebsiella urinary tract infection. He presented with shortness of breath and he has not been feeling well. He was admitted as acute on chronic respiratory failure secondary to chronic obstructive pulmonary disease exacerbation and acute on chronic renal failure along with urinary tract infection. He also had evidence of systemic inflammatory response syndrome at admission. When he came in, his urinalysis showed that he had 3+ leukocyte esterase and 300 WBCs per high-power field. He has been started on ertapenem and he has completed about six days of ertapenem in the hospital stay. His urine culture again grew greater than 100,000 colonies of Escherichia coli with extended spectrum beta-lactamase. I am going to discharge him on IV ertapenem, PICC line has been inserted, to get Craig Love total of 14 days of antibiotics. The patient also had evidence of urinary retention when he came in. After the Foley catheter was inserted, 800  mL of urine was drained. He is already on Avodart and Flomax. Dr. Achilles Dunkope saw him and suggested to discharge him with Craig Love Foley catheter with Craig Love voiding trial as outpatient. He has baseline chronic kidney disease but he presented with acute renal failure. His creatinine when he came in was in the range of 2.59. After the Foley catheter was inserted and the urinary tract infection was treated, his creatinine had improved to 1.62 at the time of discharge which is around his baseline. His white count at admission was normal around 9.5 and it remained stable. His hemoglobin has been stable in the range of 9.3. He was given DuoNebs and he was given steroid taper. He has almost completed his steroid taper , will give him  for Craig Love few more days at home. His quinapril has been suspended right now. He was hyperkalemic; that has resolved. I am going to hold his quinapril at this time. If his creatinine remains stable, then his quinapril can be restarted as outpatient. An ultrasound of the kidney was done because of his urinary retention which showed normal appearance of both kidneys without any evidence of obstruction. Renal cortex on the right remains lower in echotexture than that of the adjacent liver. This was after the Foley catheter was inserted. His chest x-ray when he came in showed questionable CHF superimposed upon chronic CHF. He had an echocardiogram done which showed that the patient has EF of greater than 55%, LV function normal, wall thickness is normal, and right ventricular function is normal. His chest x-ray at discharge showed pulmonary vascularity less prominent on previous exam so it has improved. No definite acute pulmonary edema on this chest x-ray at discharge. He is saturating 95%  on room air and he uses oxygen at bedtime which I am going to continue. His blood cultures remained negative. One of his blood cultures when he came in grew Staph hominis, most likely Craig Love contamination with skin flora but repeat blood  cultures were negative. His insulin has been slightly advanced because his sugars are running high.   MEDICATIONS AT DISCHARGE (HOME MEDICATIONS):  1. Polyethylene glycol twice Craig Love day.  2. Colace 200 mg twice Craig Love day as needed.  3. Spiriva HandiHaler daily.  4. Flomax 0.4 mg daily.  5. Advair Diskus 250/50 b.i.d.  6. DuoNeb q.4 hours as needed.  7. Aspirin 81 mg daily.  8. Atorvastatin 20 mg daily.  9. Cholecalciferol 800 units daily. 10. Voltaren topical gel 2 grams topically to affected joints 4 times Craig Love day as needed.  11. Multivitamin daily.  12. Neurontin 300 mg b.i.d.  13. Protonix 40 mg daily. 14. Plavix 75 mg daily.  15. Risperidone 0.25 mg b.i.d.  16. Sensipar 60 mg b.i.d.  17. Sertraline 100 mg 1.5 tablets daily. 18. Metoprolol ER 100 mg daily.  19. Ketoconazole shampoo 3 times Craig Love week.  20. Betamethasone topical cream as needed. 21. VESIcare 5 mg daily. 22. Tylenol 2 tabs 3 times Craig Love day as needed. 23. Nitroglycerin sublingual p.r.n.  24. Avodart 0.5 mg daily.  25. Prednisone 20 mg p.o. once daily for two days and 10 mg p.o. once daily for two days and discontinue. 26. Ertapenem 1 gram IV piggyback once daily for eight days. 27. Change Novolin 70/30 70 units in the morning and 60 units in p.m.   DO NOT TAKE: Quinapril.   CONDITION AT DISCHARGE: He is comfortable. He is back to his baseline. T-max 98.1, heart rate 63, blood pressure 146/69, saturating 95% on room air. CHEST is clear. HEART sounds are regular. ABDOMEN soft, nontender. No lower extremity edema.   FOLLOW-UP:  1. The patient should follow-up with Dr. Maryruth Hancock in one week. Follow-up CBC, BMP at Dr. Eliane Decree office.  2. Follow-up with Dr. Achilles Dunk in one week for voiding trial.   SPECIAL INSTRUCTIONS: 1. Foley to leg bag.  2. Continue CPAP at bedtime.  3. PICC line care as per protocol.   HOME OXYGEN: 2 liters at bedtime with CPAP.  DIET: Low sodium, ADA, low cholesterol diet.        TIME SPENT ON  DISCHARGE: 60 minutes.  ____________________________ Fredia Sorrow, MD ag:drc D: 01/28/2012 13:49:29 ET T: 01/29/2012 10:14:13 ET JOB#: [id number]  cc: Fredia Sorrow, MD, <Dictator> Sarah "Sallie" Allena Katz, MD Madolyn Frieze. Achilles Dunk, MD Fredia Sorrow MD ELECTRONICALLY SIGNED 02/04/2012 15:40

## 2015-02-12 NOTE — H&P (Signed)
PATIENT NAME:  [name], Craig Love#:  [id number] DATE OF BIRTH:  09-Oct-1946  DATE OF ADMISSION:  01/22/2012  ADDENDUM  The patient was still complaining about pressure in his bladder, probably due to a urinary tract infection. We inserted a Foley catheter, got 800 mL out so this makes me suspicious of obstructive uropathy may be causing his renal failure and certainly contributing to his urinary tract infection. Will leave the Foley in for now. He has been seen by Dr. Achilles Dunkope in the past and his family says he is actually being [facility]. Will consider doing urology consult while he is in the hospital.  TOTAL TIME SPENT ON ADDENDUM: 20 minutes.   ____________________________ Gracelyn NurseJohn D. Johnston, MD jdj:cms D: 01/22/2012 20:54:36 ET T: 01/23/2012 06:48:38 ET JOB#: [id number]  cc: Gracelyn NurseJohn D. Johnston, MD, <Dictator> Gracelyn NurseJOHN D JOHNSTON MD ELECTRONICALLY SIGNED 01/26/2012 23:58

## 2015-02-12 NOTE — Op Note (Signed)
PATIENT NAME:  Craig Love, Craig Love MR#:  [id number] DATE OF BIRTH:  December 30, 1945  DATE OF PROCEDURE:  01/27/2012  PREOPERATIVE DIAGNOSES:  1. Complicated urinary tract infection requiring long-term intravenous antibiotics.  2. Chronic renal insufficiency.  3. Morbid obesity.   POSTOPERATIVE DIAGNOSES:  1. Complicated urinary tract infection requiring long-term intravenous antibiotics.  2. Chronic renal insufficiency.  3. Morbid obesity.   PROCEDURES:  1. Ultrasound guidance for vascular access to the right basilic vein.  2. Fluoroscopic guidance for placement of catheter.  3. Insertion of peripherally inserted central venous catheter, right arm.  SURGEON:  Festus BarrenJason Dew, MD  ANESTHESIA: Local.   ESTIMATED BLOOD LOSS: Minimal.  FLUOROSCOPY TIME:  Less than 1 minute.   INDICATION FOR PROCEDURE:  This is Love morbidly obese male with complicated urinary tract infection requiring prolonged intravenous antibiotics and PICC line is requested.   DESCRIPTION OF PROCEDURE: The patient's right arm was sterilely prepped and draped, and Love sterile surgical field was created. The brachial vein was accessed under direct ultrasound guidance without difficulty with Love micropuncture needle and permanent image was recorded. 0.018 wire was then placed into the superior vena cava. Peel-away sheath was placed over the wire. Love single lumen peripherally inserted central venous catheter was then placed over the wire and the wire and peel-away sheath were removed. The catheter tip was placed into the superior vena cava and was secured at the skin at 49 cm with Love sterile dressing. The catheter withdrew blood well and flushed easily with heparinized saline.  The patient tolerated procedure well.  ____________________________ Annice NeedyJason S. Dew, MD jsd:bjt D: 01/27/2012 13:40:47 ET T: 01/28/2012 07:51:49 ET JOB#: [id number]  cc: Annice NeedyJason S. Dew, MD, <Dictator> Annice NeedyJASON S DEW MD ELECTRONICALLY SIGNED 01/30/2012 12:01

## 2016-02-19 DEATH — deceased
